# Patient Record
Sex: Female | Born: 1980
Health system: Southern US, Community
[De-identification: ages and names within clinical notes are randomized; demographics above are authoritative.]

## PROBLEM LIST (undated history)

## (undated) DIAGNOSIS — F4310 Post-traumatic stress disorder, unspecified: Secondary | ICD-10-CM

## (undated) DIAGNOSIS — G43909 Migraine, unspecified, not intractable, without status migrainosus: Secondary | ICD-10-CM

## (undated) DIAGNOSIS — M549 Dorsalgia, unspecified: Secondary | ICD-10-CM

## (undated) DIAGNOSIS — M5430 Sciatica, unspecified side: Secondary | ICD-10-CM

## (undated) DIAGNOSIS — E042 Nontoxic multinodular goiter: Secondary | ICD-10-CM

## (undated) HISTORY — PX: ANKLE SURGERY: SHX546

## (undated) HISTORY — PX: TUBAL LIGATION: SHX77

## (undated) HISTORY — DX: Nontoxic multinodular goiter: E04.2

## (undated) HISTORY — DX: Migraine, unspecified, not intractable, without status migrainosus: G43.909

---

## 1999-10-25 ENCOUNTER — Emergency Department (HOSPITAL_COMMUNITY): Admission: EM | Admit: 1999-10-25 | Discharge: 1999-10-25 | Payer: Self-pay | Admitting: Emergency Medicine

## 2004-07-03 ENCOUNTER — Emergency Department (HOSPITAL_COMMUNITY): Admission: EM | Admit: 2004-07-03 | Discharge: 2004-07-03 | Payer: Self-pay | Admitting: Family Medicine

## 2004-08-17 ENCOUNTER — Emergency Department (HOSPITAL_COMMUNITY): Admission: EM | Admit: 2004-08-17 | Discharge: 2004-08-18 | Payer: Self-pay | Admitting: Emergency Medicine

## 2004-08-23 LAB — US OB LIMITED

## 2004-09-17 ENCOUNTER — Inpatient Hospital Stay (HOSPITAL_COMMUNITY): Admission: AD | Admit: 2004-09-17 | Discharge: 2004-09-18 | Payer: Self-pay | Admitting: *Deleted

## 2004-09-22 ENCOUNTER — Inpatient Hospital Stay (HOSPITAL_COMMUNITY): Admission: AD | Admit: 2004-09-22 | Discharge: 2004-09-22 | Payer: Self-pay | Admitting: Obstetrics and Gynecology

## 2004-10-01 ENCOUNTER — Ambulatory Visit: Payer: Self-pay | Admitting: Obstetrics & Gynecology

## 2004-10-22 ENCOUNTER — Ambulatory Visit: Payer: Self-pay | Admitting: Obstetrics & Gynecology

## 2004-11-13 ENCOUNTER — Ambulatory Visit (HOSPITAL_COMMUNITY): Admission: RE | Admit: 2004-11-13 | Discharge: 2004-11-13 | Payer: Self-pay | Admitting: *Deleted

## 2004-11-19 ENCOUNTER — Ambulatory Visit: Payer: Self-pay | Admitting: *Deleted

## 2004-12-10 ENCOUNTER — Ambulatory Visit: Payer: Self-pay | Admitting: *Deleted

## 2004-12-17 ENCOUNTER — Ambulatory Visit: Payer: Self-pay | Admitting: *Deleted

## 2004-12-22 ENCOUNTER — Inpatient Hospital Stay (HOSPITAL_COMMUNITY): Admission: AD | Admit: 2004-12-22 | Discharge: 2004-12-22 | Payer: Self-pay | Admitting: Obstetrics & Gynecology

## 2004-12-31 ENCOUNTER — Inpatient Hospital Stay (HOSPITAL_COMMUNITY): Admission: AD | Admit: 2004-12-31 | Discharge: 2005-01-04 | Payer: Self-pay | Admitting: Obstetrics and Gynecology

## 2004-12-31 ENCOUNTER — Ambulatory Visit: Payer: Self-pay | Admitting: Family Medicine

## 2004-12-31 ENCOUNTER — Ambulatory Visit: Payer: Self-pay | Admitting: *Deleted

## 2005-01-07 ENCOUNTER — Ambulatory Visit: Payer: Self-pay | Admitting: Obstetrics & Gynecology

## 2005-01-14 ENCOUNTER — Ambulatory Visit: Payer: Self-pay | Admitting: *Deleted

## 2005-01-28 ENCOUNTER — Ambulatory Visit: Payer: Self-pay | Admitting: *Deleted

## 2005-02-02 ENCOUNTER — Ambulatory Visit (HOSPITAL_COMMUNITY): Admission: RE | Admit: 2005-02-02 | Discharge: 2005-02-02 | Payer: Self-pay | Admitting: Obstetrics and Gynecology

## 2005-02-11 ENCOUNTER — Ambulatory Visit: Payer: Self-pay | Admitting: *Deleted

## 2005-02-16 ENCOUNTER — Encounter: Admission: RE | Admit: 2005-02-16 | Discharge: 2005-03-10 | Payer: Self-pay | Admitting: *Deleted

## 2005-02-18 ENCOUNTER — Ambulatory Visit: Payer: Self-pay | Admitting: *Deleted

## 2005-03-04 ENCOUNTER — Ambulatory Visit: Payer: Self-pay | Admitting: Obstetrics & Gynecology

## 2005-03-04 ENCOUNTER — Ambulatory Visit (HOSPITAL_COMMUNITY): Admission: RE | Admit: 2005-03-04 | Discharge: 2005-03-04 | Payer: Self-pay | Admitting: *Deleted

## 2005-03-11 ENCOUNTER — Ambulatory Visit: Payer: Self-pay | Admitting: *Deleted

## 2005-03-13 ENCOUNTER — Inpatient Hospital Stay (HOSPITAL_COMMUNITY): Admission: AD | Admit: 2005-03-13 | Discharge: 2005-03-13 | Payer: Self-pay | Admitting: *Deleted

## 2005-03-16 ENCOUNTER — Inpatient Hospital Stay (HOSPITAL_COMMUNITY): Admission: AD | Admit: 2005-03-16 | Discharge: 2005-03-16 | Payer: Self-pay | Admitting: Obstetrics and Gynecology

## 2005-03-18 ENCOUNTER — Ambulatory Visit: Payer: Self-pay | Admitting: *Deleted

## 2005-03-21 ENCOUNTER — Emergency Department (HOSPITAL_COMMUNITY): Admission: AD | Admit: 2005-03-21 | Discharge: 2005-03-21 | Payer: Self-pay | Admitting: Family Medicine

## 2005-03-22 ENCOUNTER — Inpatient Hospital Stay (HOSPITAL_COMMUNITY): Admission: AD | Admit: 2005-03-22 | Discharge: 2005-03-22 | Payer: Self-pay | Admitting: *Deleted

## 2005-03-25 ENCOUNTER — Ambulatory Visit: Payer: Self-pay | Admitting: Obstetrics & Gynecology

## 2005-03-29 ENCOUNTER — Inpatient Hospital Stay (HOSPITAL_COMMUNITY): Admission: AD | Admit: 2005-03-29 | Discharge: 2005-04-01 | Payer: Self-pay | Admitting: *Deleted

## 2005-03-29 ENCOUNTER — Ambulatory Visit: Payer: Self-pay | Admitting: *Deleted

## 2005-04-03 ENCOUNTER — Inpatient Hospital Stay (HOSPITAL_COMMUNITY): Admission: AD | Admit: 2005-04-03 | Discharge: 2005-04-03 | Payer: Self-pay | Admitting: *Deleted

## 2005-04-06 ENCOUNTER — Inpatient Hospital Stay (HOSPITAL_COMMUNITY): Admission: AD | Admit: 2005-04-06 | Discharge: 2005-04-06 | Payer: Self-pay | Admitting: Internal Medicine

## 2005-05-23 ENCOUNTER — Emergency Department (HOSPITAL_COMMUNITY): Admission: EM | Admit: 2005-05-23 | Discharge: 2005-05-23 | Payer: Self-pay | Admitting: Emergency Medicine

## 2007-12-01 DIAGNOSIS — E042 Nontoxic multinodular goiter: Secondary | ICD-10-CM

## 2007-12-01 HISTORY — DX: Nontoxic multinodular goiter: E04.2

## 2010-12-21 ENCOUNTER — Encounter: Payer: Self-pay | Admitting: *Deleted

## 2012-12-23 ENCOUNTER — Encounter (HOSPITAL_BASED_OUTPATIENT_CLINIC_OR_DEPARTMENT_OTHER): Payer: Self-pay

## 2012-12-23 ENCOUNTER — Emergency Department (HOSPITAL_BASED_OUTPATIENT_CLINIC_OR_DEPARTMENT_OTHER): Payer: 59

## 2012-12-23 ENCOUNTER — Emergency Department (HOSPITAL_BASED_OUTPATIENT_CLINIC_OR_DEPARTMENT_OTHER)
Admission: EM | Admit: 2012-12-23 | Discharge: 2012-12-23 | Disposition: A | Discharge status: Home / Self Care | Payer: 59 | Attending: Emergency Medicine | Admitting: Emergency Medicine

## 2012-12-23 DIAGNOSIS — M25569 Pain in unspecified knee: Secondary | ICD-10-CM | POA: Insufficient documentation

## 2012-12-23 DIAGNOSIS — M25469 Effusion, unspecified knee: Secondary | ICD-10-CM | POA: Insufficient documentation

## 2012-12-23 DIAGNOSIS — F17200 Nicotine dependence, unspecified, uncomplicated: Secondary | ICD-10-CM | POA: Insufficient documentation

## 2012-12-23 DIAGNOSIS — M2550 Pain in unspecified joint: Secondary | ICD-10-CM

## 2012-12-23 LAB — BASIC METABOLIC PANEL
BUN: 10 mg/dL (ref 6–23)
CO2: 24 mEq/L (ref 19–32)
Calcium: 9 mg/dL (ref 8.4–10.5)
Chloride: 103 mEq/L (ref 96–112)
Creatinine, Ser: 0.8 mg/dL (ref 0.50–1.10)
GFR calc Af Amer: >90 mL/min (ref >90)
GFR calc non Af Amer: >90 mL/min (ref >90)
Glucose, Bld: 106 mg/dL — ABNORMAL HIGH (ref 70–99)
Potassium: 3.8 mEq/L (ref 3.5–5.1)
Sodium: 137 mEq/L (ref 135–145)

## 2012-12-23 LAB — CBC WITH DIFFERENTIAL/PLATELET
Basophils Absolute: 0 10*3/uL (ref 0.0–0.1)
Basophils Relative: 0 % (ref 0–1)
Eosinophils Absolute: 0.4 10*3/uL (ref 0.0–0.7)
Eosinophils Relative: 6 % — ABNORMAL HIGH (ref 0–5)
HCT: 37.7 % (ref 36.0–46.0)
Hemoglobin: 12.9 g/dL (ref 12.0–15.0)
Lymphocytes Relative: 39 % (ref 12–46)
Lymphs Abs: 2.5 10*3/uL (ref 0.7–4.0)
MCH: 31.5 pg (ref 26.0–34.0)
MCHC: 34.2 g/dL (ref 30.0–36.0)
MCV: 92.2 fL (ref 78.0–100.0)
Monocytes Absolute: 1.1 10*3/uL — ABNORMAL HIGH (ref 0.1–1.0)
Monocytes Relative: 16 % — ABNORMAL HIGH (ref 3–12)
Neutro Abs: 2.5 10*3/uL (ref 1.7–7.7)
Neutrophils Relative %: 39 % — ABNORMAL LOW (ref 43–77)
Platelets: 222 10*3/uL (ref 150–400)
RBC: 4.09 MIL/uL (ref 3.87–5.11)
RDW: 12.6 % (ref 11.5–15.5)
WBC: 6.5 10*3/uL (ref 4.0–10.5)

## 2012-12-23 LAB — D-DIMER, QUANTITATIVE: D-Dimer, Quant: 0.27 ug/mL-FEU (ref 0.00–0.48)

## 2012-12-23 MED ORDER — DIPHENHYDRAMINE HCL 25 MG PO CAPS
25.0000 mg | ORAL_CAPSULE | Freq: Once | ORAL | Status: AC
  Administered 2012-12-23: 25 mg via ORAL

## 2012-12-23 MED ORDER — NAPROXEN 500 MG PO TABS: 500.0000 mg | ORAL_TABLET | Freq: Two times a day (BID) | ORAL | Status: DC

## 2012-12-23 MED ORDER — OXYCODONE-ACETAMINOPHEN 5-325 MG PO TABS
1.0000 | ORAL_TABLET | Freq: Once | ORAL | Status: AC
  Administered 2012-12-23: 1 via ORAL
  Filled 2012-12-23 (×2): qty 1

## 2012-12-23 MED ORDER — HYDROCODONE-ACETAMINOPHEN 5-500 MG PO TABS: 1.0000 | ORAL_TABLET | Freq: Four times a day (QID) | ORAL | Status: DC | PRN

## 2012-12-23 MED ORDER — DIPHENHYDRAMINE HCL 25 MG PO CAPS
ORAL_CAPSULE | ORAL | Status: AC
  Administered 2012-12-23: 25 mg via ORAL
  Filled 2012-12-23: qty 1

## 2012-12-23 NOTE — ED Notes (Signed)
Patient transported to X-ray 

## 2012-12-23 NOTE — ED Notes (Signed)
Pt returned from radiology.

## 2012-12-23 NOTE — ED Notes (Signed)
Pt reports onset of left knee pain/swelling and bilateral ankle swelling x 2 days.

## 2012-12-23 NOTE — ED Provider Notes (Signed)
History    CSN: [id number] Arrival date & time 12/23/12  1041 First MD Initiated Contact with Patient 12/23/12 1124      Chief Complaint  Patient presents with  . Joint Swelling  . Knee Pain    HPI The patient started having pain in her left knee and both ankles a few days ago. She's not sure what triggered it. She has not had any particular injuries or illnesses.  The pain is primarily in her left knee as well as both ankles. She feels that they are somewhat swollen. The pain is sharp and increases with movement. She has no history of any rheumatologic conditions. She has not had any trouble with calf pain and denies any chest pain.  She has not had any cough. No  history of DVT or PE. Patient denies any general medical problems.  History reviewed. No pertinent past medical history.  Past Surgical History  Procedure Date  . Cesarean section     No family history on file.  History  Substance Use Topics  . Smoking status: Current Every Day Smoker -- 0.5 packs/day    Types: Cigarettes  . Smokeless tobacco: Not on file  . Alcohol Use: No    OB History    Grav Para Term Preterm Abortions TAB SAB Ect Mult Living                  Review of Systems  Constitutional: Negative for fever and fatigue.  Respiratory: Negative for cough.   Gastrointestinal: Negative for vomiting and diarrhea.  Genitourinary: Negative for frequency.  Neurological: Negative for seizures.  Hematological: Negative for adenopathy. Does not bruise/bleed easily.  All other systems reviewed and are negative.    Allergies  Review of patient's allergies indicates no known allergies.  Home Medications  No current outpatient prescriptions on file.  BP 130/89  Pulse 96  Temp 98.9 F (37.2 C) (Oral)  Resp 18  Ht 5\' 2"  (1.575 m)  Wt 216 lb (97.977 kg)  BMI 39.51 kg/m2  SpO2 100%  Physical Exam  Nursing note and vitals reviewed. Constitutional: She appears well-developed and well-nourished. No  distress.  HENT:  Head: Normocephalic and atraumatic.  Right Ear: External ear normal.  Left Ear: External ear normal.  Eyes: Conjunctivae normal are normal. Right eye exhibits no discharge. Left eye exhibits no discharge. No scleral icterus.  Neck: Neck supple. No tracheal deviation present.  Cardiovascular: Normal rate, regular rhythm and intact distal pulses.   Pulmonary/Chest: Effort normal and breath sounds normal. No stridor. No respiratory distress. She has no wheezes. She has no rales.  Abdominal: Soft. Bowel sounds are normal. She exhibits no distension. There is no tenderness. There is no rebound and no guarding.  Musculoskeletal: She exhibits no edema and no tenderness.       Left knee: She exhibits no swelling and no effusion. tenderness found.       Right ankle: She exhibits no swelling, no ecchymosis and no deformity. tenderness.       Left ankle: She exhibits no swelling, no ecchymosis and no deformity. tenderness.       No effusion or edema noted  Neurological: She is alert. She has normal strength. No sensory deficit. Cranial nerve deficit:  no gross defecits noted. She exhibits normal muscle tone. She displays no seizure activity. Coordination normal.  Skin: Skin is warm and dry. No rash noted.  Psychiatric: She has a normal mood and affect.    ED Course  Procedures (  including critical care time)  Labs Reviewed  CBC WITH DIFFERENTIAL - Abnormal; Notable for the following:    Neutrophils Relative 39 (*)     Monocytes Relative 16 (*)     Monocytes Absolute 1.1 (*)     Eosinophils Relative 6 (*)     All other components within normal limits  BASIC METABOLIC PANEL - Abnormal; Notable for the following:    Glucose, Bld 106 (*)     All other components within normal limits  D-DIMER, QUANTITATIVE   Dg Knee 1-2 Views Left  12/23/2012  *RADIOLOGY REPORT*  Clinical Data: Joint swelling, pain.  LEFT KNEE - 1-2 VIEW  Comparison: None.  Findings: No acute bony abnormality.   Specifically, no fracture, subluxation, or dislocation.  Soft tissues are intact.  No joint effusion.  IMPRESSION: No acute bony abnormality.   Original Report Authenticated By: Charlett Nose, M.D.        MDM  The patient's laboratory tests and x-rays are unremarkable. On physical exam she has no evidence of joint effusion, or erythema.  Her symptoms did not suggest a septic arthritis. Is possible that her symptoms could be related to some type of viral illness. It is also possible she could have some of rheumatological condition. at this point there does not appear to be evidence of an acute emergency medical condition. I recommend followup with primary-care Dr. if the symptoms persist        Celene Kras, MD 12/23/12 1246

## 2012-12-30 ENCOUNTER — Encounter (HOSPITAL_BASED_OUTPATIENT_CLINIC_OR_DEPARTMENT_OTHER): Payer: Self-pay | Admitting: *Deleted

## 2012-12-30 ENCOUNTER — Emergency Department (HOSPITAL_BASED_OUTPATIENT_CLINIC_OR_DEPARTMENT_OTHER)
Admission: EM | Admit: 2012-12-30 | Discharge: 2012-12-30 | Disposition: A | Discharge status: Home / Self Care | Payer: 59 | Attending: Emergency Medicine | Admitting: Emergency Medicine

## 2012-12-30 ENCOUNTER — Emergency Department (HOSPITAL_BASED_OUTPATIENT_CLINIC_OR_DEPARTMENT_OTHER): Payer: 59

## 2012-12-30 DIAGNOSIS — F17200 Nicotine dependence, unspecified, uncomplicated: Secondary | ICD-10-CM | POA: Insufficient documentation

## 2012-12-30 DIAGNOSIS — M79605 Pain in left leg: Secondary | ICD-10-CM

## 2012-12-30 DIAGNOSIS — Z79899 Other long term (current) drug therapy: Secondary | ICD-10-CM | POA: Insufficient documentation

## 2012-12-30 DIAGNOSIS — M79609 Pain in unspecified limb: Secondary | ICD-10-CM | POA: Insufficient documentation

## 2012-12-30 MED ORDER — PREDNISONE 10 MG PO TABS: 20.0000 mg | ORAL_TABLET | Freq: Two times a day (BID) | ORAL | Status: DC

## 2012-12-30 NOTE — ED Provider Notes (Signed)
History     CSN: [id number]  Arrival date & time 12/30/12  1554   First MD Initiated Contact with Patient 12/30/12 1628      Chief Complaint  Patient presents with  . Leg Pain    (Consider location/radiation/quality/duration/timing/severity/associated sxs/prior treatment) HPI Comments: Patient for eval of 1 1/2 week history of pain in the back of the left knee and calf.  She denies any injury or trauma.  She was seen here for the same one week ago and given nsaids and pain meds.  She is not feeling any better.  No chest pain or shortness of breath.  Patient is a 32 y.o. female presenting with leg pain. The history is provided by the patient.  Leg Pain  Incident onset: 1 1/2 weeks ago. There was no injury mechanism. Pain location: back of left knee and calf. The quality of the pain is described as aching and throbbing. The pain is moderate. The pain has been constant since onset. Pertinent negatives include no numbness, no loss of motion and no loss of sensation. The symptoms are aggravated by activity, bearing weight and palpation.    History reviewed. No pertinent past medical history.  Past Surgical History  Procedure Date  . Cesarean section     No family history on file.  History  Substance Use Topics  . Smoking status: Current Every Day Smoker -- 0.5 packs/day    Types: Cigarettes  . Smokeless tobacco: Not on file  . Alcohol Use: No    OB History    Grav Para Term Preterm Abortions TAB SAB Ect Mult Living                  Review of Systems  Neurological: Negative for numbness.  All other systems reviewed and are negative.    Allergies  Review of patient's allergies indicates no known allergies.  Home Medications   Current Outpatient Rx  Name  Route  Sig  Dispense  Refill  . HYDROCODONE-ACETAMINOPHEN 5-500 MG PO TABS   Oral   Take 1-2 tablets by mouth every 6 (six) hours as needed for pain.   15 tablet   0   . NAPROXEN 500 MG PO TABS   Oral   Take  1 tablet (500 mg total) by mouth 2 (two) times daily with a meal.   30 tablet   0     BP 126/81  Pulse 101  Temp 98 F (36.7 C) (Oral)  Resp 18  Ht 5\' 2"  (1.575 m)  Wt 216 lb (97.977 kg)  BMI 39.51 kg/m2  SpO2 99%  LMP 12/19/2012  Physical Exam  Nursing note and vitals reviewed. Constitutional: She is oriented to person, place, and time. She appears well-developed and well-nourished. No distress.  HENT:  Head: Normocephalic and atraumatic.  Mouth/Throat: Oropharynx is clear and moist.  Neck: Normal range of motion. Neck supple.  Musculoskeletal:       The left knee appears grossly normal.  There is no definite effusion.  There is tpp in the posterior aspect of the knee and upper calf.  There is no edema or distal swelling.  Homans sign is absent.  Neurological: She is alert and oriented to person, place, and time.  Skin: Skin is warm and dry. She is not diaphoretic.    ED Course  Procedures (including critical care time)  Labs Reviewed - No data to display No results found.   No diagnosis found.    MDM  The xrays  from last week are negative and the dvt study today is negative as well.  Her discomfort is mainly in the back of the upper calf and knee.  I will treat her with prednisone, give contact information for orthopedic follow up.          Geoffery Lyons, MD 12/30/12 [phone]

## 2012-12-30 NOTE — ED Notes (Signed)
Onset x 11/2 weeks  Seen here for same last Friday

## 2012-12-30 NOTE — Discharge Instructions (Signed)
Pain of Unknown Etiology (Pain Without a Known Cause)  You have come to your caregiver because of pain. Pain can occur in any part of the body. Often there is not a definite cause. If your laboratory (blood or urine) work was normal and x-rays or other studies were normal, your caregiver may treat you without knowing the cause of the pain. An example of this is the headache. Most headaches are diagnosed by taking a history. This means your caregiver asks you questions about your headaches. Your caregiver determines a treatment based on your answers. Usually testing done for headaches is normal. Often testing is not done unless there is no response to medications. Regardless of where your pain is located today, you can be given medications to make you comfortable. If no physical cause of pain can be found, most cases of pain will gradually leave as suddenly as they came.   If you have a painful condition and no reason can be found for the pain, It is importantthat you follow up with your caregiver. If the pain becomes worse or does not go away, it may be necessary to repeat tests and look further for a possible cause.   Only take over-the-counter or prescription medicines for pain, discomfort, or fever as directed by your caregiver.   For the protection of your privacy, test results can not be given over the phone. Make sure you receive the results of your test. Ask as to how these results are to be obtained if you have not been informed. It is your responsibility to obtain your test results.   You may continue all activities unless the activities cause more pain. When the pain lessens, it is important to gradually resume normal activities. Resume activities by beginning slowly and gradually increasing the intensity and duration of the activities or exercise. During periods of severe pain, bed-rest may be helpful. Lay or sit in any position that is comfortable.   Ice used for acute (sudden) conditions may be  effective. Use a large plastic bag filled with ice and wrapped in a towel. This may provide pain relief.   See your caregiver for continued problems. They can help or refer you for exercises or physical therapy if necessary.  If you were given medications for your condition, do not drive, operate machinery or power tools, or sign legal documents for 24 hours. Do not drink alcohol, take sleeping pills, or take other medications that may interfere with treatment.  See your caregiver immediately if you have pain that is becoming worse and not relieved by medications.  Document Released: 08/11/2001 Document Revised: 02/08/2012 Document Reviewed: 11/16/2005  Exit[employer] Patient Information 2013 [employer].

## 2013-02-03 ENCOUNTER — Ambulatory Visit: Payer: 59 | Admitting: Family

## 2013-02-14 ENCOUNTER — Encounter: Payer: Self-pay | Admitting: Family

## 2013-02-14 ENCOUNTER — Ambulatory Visit (INDEPENDENT_AMBULATORY_CARE_PROVIDER_SITE_OTHER): Payer: 59 | Admitting: Family

## 2013-02-14 VITALS — BP 118/80 | HR 88 | Temp 98.9°F | Resp 16 | Ht 64.0 in | Wt 209.1 lb

## 2013-02-14 DIAGNOSIS — IMO0001 Reserved for inherently not codable concepts without codable children: Secondary | ICD-10-CM

## 2013-02-14 DIAGNOSIS — F418 Other specified anxiety disorders: Secondary | ICD-10-CM

## 2013-02-14 DIAGNOSIS — Z862 Personal history of diseases of the blood and blood-forming organs and certain disorders involving the immune mechanism: Secondary | ICD-10-CM

## 2013-02-14 DIAGNOSIS — N760 Acute vaginitis: Secondary | ICD-10-CM | POA: Insufficient documentation

## 2013-02-14 DIAGNOSIS — G43909 Migraine, unspecified, not intractable, without status migrainosus: Secondary | ICD-10-CM

## 2013-02-14 DIAGNOSIS — J309 Allergic rhinitis, unspecified: Secondary | ICD-10-CM

## 2013-02-14 DIAGNOSIS — F341 Dysthymic disorder: Secondary | ICD-10-CM

## 2013-02-14 DIAGNOSIS — M2550 Pain in unspecified joint: Secondary | ICD-10-CM

## 2013-02-14 DIAGNOSIS — R32 Unspecified urinary incontinence: Secondary | ICD-10-CM

## 2013-02-14 DIAGNOSIS — Z8639 Personal history of other endocrine, nutritional and metabolic disease: Secondary | ICD-10-CM

## 2013-02-14 DIAGNOSIS — F17200 Nicotine dependence, unspecified, uncomplicated: Secondary | ICD-10-CM

## 2013-02-14 DIAGNOSIS — Z720 Tobacco use: Secondary | ICD-10-CM | POA: Insufficient documentation

## 2013-02-14 DIAGNOSIS — N898 Other specified noninflammatory disorders of vagina: Secondary | ICD-10-CM

## 2013-02-14 DIAGNOSIS — E0590 Thyrotoxicosis, unspecified without thyrotoxic crisis or storm: Secondary | ICD-10-CM

## 2013-02-14 LAB — CBC WITH DIFFERENTIAL/PLATELET
Basophils Absolute: 0 10*3/uL (ref 0.0–0.1)
Basophils Relative: 0 % (ref 0–1)
Eosinophils Absolute: 0.2 10*3/uL (ref 0.0–0.7)
Eosinophils Relative: 3 % (ref 0–5)
HCT: 40.4 % (ref 36.0–46.0)
Hemoglobin: 13.5 g/dL (ref 12.0–15.0)
Lymphocytes Relative: 39 % (ref 12–46)
Lymphs Abs: 2.8 10*3/uL (ref 0.7–4.0)
MCH: 29.7 pg (ref 26.0–34.0)
MCHC: 33.4 g/dL (ref 30.0–36.0)
MCV: 88.8 fL (ref 78.0–100.0)
Monocytes Absolute: 0.6 10*3/uL (ref 0.1–1.0)
Monocytes Relative: 9 % (ref 3–12)
Neutro Abs: 3.4 10*3/uL (ref 1.7–7.7)
Neutrophils Relative %: 49 % (ref 43–77)
Platelets: 272 10*3/uL (ref 150–400)
RBC: 4.55 MIL/uL (ref 3.87–5.11)
RDW: 13.1 % (ref 11.5–15.5)
WBC: 7 10*3/uL (ref 4.0–10.5)

## 2013-02-14 LAB — HEPATIC FUNCTION PANEL
ALT: 12 U/L (ref 0–35)
AST: 15 U/L (ref 0–37)
Albumin: 4.3 g/dL (ref 3.5–5.2)
Alkaline Phosphatase: 58 U/L (ref 39–117)
Bilirubin, Direct: 0.1 mg/dL (ref 0.0–0.3)
Indirect Bilirubin: 0.3 mg/dL (ref 0.0–0.9)
Total Bilirubin: 0.4 mg/dL (ref 0.3–1.2)
Total Protein: 7.3 g/dL (ref 6.0–8.3)

## 2013-02-14 LAB — SEDIMENTATION RATE: Sed Rate: 11 mm/hr (ref 0–22)

## 2013-02-14 LAB — T3, FREE: T3, Free: 3 pg/mL (ref 2.3–4.2)

## 2013-02-14 LAB — TSH: TSH: 0.706 u[IU]/mL (ref 0.350–4.500)

## 2013-02-14 MED ORDER — LORATADINE 10 MG PO TABS: 10.0000 mg | ORAL_TABLET | Freq: Every day | ORAL | Status: DC

## 2013-02-14 MED ORDER — DULOXETINE HCL 30 MG PO CPEP: ORAL_CAPSULE | ORAL | Status: DC

## 2013-02-14 NOTE — Assessment & Plan Note (Signed)
Trial of claritin.  Likely cause for hoarsenss/nasal congestion.

## 2013-02-14 NOTE — Assessment & Plan Note (Signed)
Tend to be menstrual migraines and improve with imitrex injection.

## 2013-02-14 NOTE — Assessment & Plan Note (Signed)
Left lobe of thyroid is prominent today on exam.  Requested records from [facility]. Will obtain TSH, free t3/freet4.  Consider follow up neck ultrasound pending review of records from [facility].

## 2013-02-14 NOTE — Assessment & Plan Note (Signed)
She is motivated to quit.  5 minutes spent counseling pt on importance of quitting smoking. She will plan to try the nicorette gum.

## 2013-02-14 NOTE — Assessment & Plan Note (Signed)
Will obtain ANA, ESR, RA.  Declines HIV as she reports that she had this drawn 2 weeks ago.

## 2013-02-14 NOTE — Progress Notes (Signed)
Subjective:    Patient ID: Kayla Jenkins, female    DOB: 1981-04-11, 32 y.o.   MRN: [id number]  HPI  Kayla Jenkins is a 32 yr old female who presents today to establish care.    Some voice hoarseness, neck stiffness,  sinus drainage (clear to yellow in color), cough.  Reports that this has been going on for 5 years.   She is not taking any medication.  She reports that these symptoms are worse in the winter time.    Tobacco abuse- She smokes 1/2 PPD.  Quit for 3 months.  She is motived to quit.    Migraine-  Has had migraines since age of 89- she uses imitrex injection.  She reports migraines tend to occur around the time of her menses.   Thyroid Nodules- She reports that she has had a nuclear study.  She was told that she was working with an urgent care.  Was completed at [facility].    Depression- reports some moodiness, occasional tearfulness. Sleeps more than "i should."  Poor energy.  Less interest in thinks she enjoys.  Denies thoughts of suicide, homicide.  Anxiety- reports + panic attacks due to feeling overwhelmed.  Panic attack last Friday at home.  Happened out of the blue.    Vaginal discharge- notes that she has occasional vaginal irritation. She has not had any new partners but wants to be checked for "everything."   Review of Systems  Constitutional: Positive for unexpected weight change.       Notes weight has been trending up.    HENT: Negative for ear pain.   Eyes:       Reports some blurring of her vision with reading.  Last eye exam was 1 yr ago- rx'd glasses  Respiratory: Negative for shortness of breath.   Cardiovascular: Negative for chest pain.  Gastrointestinal: Negative for nausea, vomiting and diarrhea.  Genitourinary: Positive for frequency. Negative for dysuria and menstrual problem.       Reports occasional slight urinary incontinence Heavy clotting with periods  Musculoskeletal: Positive for arthralgias. Negative for myalgias.       + knee pain  Skin:  Negative for rash.  Neurological: Positive for headaches.  Hematological: Positive for adenopathy.       Reports some swollen glands in her neck  Psychiatric/Behavioral:       Reports + depression/anxiety   Past Medical History  Diagnosis Date  . Migraine   . Multiple thyroid nodules 2009    History   Social History  . Marital Status: Married    Spouse Name: N/A    Number of Children: N/A  . Years of Education: N/A   Occupational History  . Not on file.   Social History Main Topics  . Smoking status: Current Every Day Smoker -- 0.50 packs/day    Types: Cigarettes  . Smokeless tobacco: Never Used  . Alcohol Use: No  . Drug Use: No  . Sexually Active: Not on file   Other Topics Concern  . Not on file   Social History Narrative   2 children   Single   Works in Training and development officer at [employer]   Rare ETOH   Denies drug use   Has good support team in the area of family/friends   Enjoys travelling.           Past Surgical History  Procedure Laterality Date  . Cesarean section      2001 and 2006    Family History  Problem Relation Age of Onset  . Hypertension Mother   . Heart disease Mother     CHF  . Seizures Father   . Hyperlipidemia Father   . Cancer Neg Hx   . Kidney disease Neg Hx     No Known Allergies  No current outpatient prescriptions on file prior to visit.   No current facility-administered medications on file prior to visit.    BP 118/80  Pulse 88  Temp(Src) 98.9 F (37.2 C) (Oral)  Resp 16  Ht 5\' 4"  (1.626 m)  Wt 209 lb 1.9 oz (94.856 kg)  BMI 35.88 kg/m2  SpO2 99%  LMP 02/07/2013       Objective:   Physical Exam  Constitutional: She is oriented to person, place, and time. She appears well-developed and well-nourished. No distress.  HENT:  Head: Normocephalic and atraumatic.  Right Ear: Tympanic membrane and ear canal normal.  Left Ear: Tympanic membrane and ear canal normal.  Neck:  Prominent left thyroid lobe.  +  thyromegaly.  Cardiovascular: Normal rate and regular rhythm.   No murmur heard. Pulmonary/Chest: Effort normal and breath sounds normal. No respiratory distress. She has no wheezes. She has no rales. She exhibits no tenderness.  Musculoskeletal: She exhibits no edema.  Neurological: She is alert and oriented to person, place, and time. She exhibits normal muscle tone.  Skin: Skin is warm and dry.  Psychiatric: She has a normal mood and affect. Her behavior is normal. Judgment and thought content normal.  External genitalia: Normal  BUS/Urethra/Skene's glands: Normal  Bladder: Normal  Vagina: Normal  Anus and perineum: Normal (GYN exam performed with female chaperone present)         Assessment & Plan:

## 2013-02-14 NOTE — Assessment & Plan Note (Signed)
Pt tender in 16/18 tender points.  I am suspicious for depression and FM.  Will plan to give trial of cymbalta.

## 2013-02-14 NOTE — Assessment & Plan Note (Signed)
Wet prep obtained today along with GC/Chlamydia.

## 2013-02-14 NOTE — Patient Instructions (Addendum)
Please start claritin 10mg  once daily. Start cymbalta 30mg  once daily on week one, then increase as tolerated to 60mg  (2 tabs) on week two. Follow up in 1 month.

## 2013-02-15 ENCOUNTER — Telehealth: Payer: Self-pay | Admitting: Family

## 2013-02-15 LAB — BASIC METABOLIC PANEL WITH GFR
BUN: 9 mg/dL (ref 6–23)
CO2: 27 mEq/L (ref 19–32)
Calcium: 9.8 mg/dL (ref 8.4–10.5)
Chloride: 104 mEq/L (ref 96–112)
Creat: 0.74 mg/dL (ref 0.50–1.10)
GFR, Est African American: >89 mL/min
GFR, Est Non African American: >89 mL/min
Glucose, Bld: 86 mg/dL (ref 70–99)
Potassium: 4.2 mEq/L (ref 3.5–5.3)
Sodium: 137 mEq/L (ref 135–145)

## 2013-02-15 LAB — RHEUMATOID FACTOR: Rheumatoid fact SerPl-aCnc: <10 IU/mL (ref <=14)

## 2013-02-15 LAB — ANA: Anti Nuclear Antibody(ANA): NEGATIVE

## 2013-02-15 LAB — WET PREP BY MOLECULAR PROBE: Gardnerella vaginalis: POSITIVE — AB

## 2013-02-15 MED ORDER — METRONIDAZOLE 500 MG PO TABS: 500.0000 mg | ORAL_TABLET | Freq: Three times a day (TID) | ORAL | Status: DC

## 2013-02-15 NOTE — Telephone Encounter (Signed)
Notified pt and she voices understanding. Spoke with pharmacist and clarified directions of metronidazole as 1 tablet twice a day instead of 3 times a day as previously sent.

## 2013-02-15 NOTE — Telephone Encounter (Addendum)
Left message on home phone requesting call back.  When pt returns call please let her know that autoimmune testing is negative. One more test is pending.  Liver thyroid, kidney function, blood count all normal.  Gonorrhea/chlamydia neg.  Wet prep notes + BV and + trichomonas.  I would like her to take metronidazole bid x 7 days for both. Avoid alcohol while on metronidazole. Partner needs to be treated for trich as well as this is a sexually transmitted infection.

## 2013-02-20 ENCOUNTER — Telehealth: Payer: Self-pay | Admitting: *Deleted

## 2013-02-20 NOTE — Telephone Encounter (Signed)
Received prior auth request on cymbalta Rx from [facility]. PA form obtained and forwarded to Provider for completion/signature.

## 2013-02-21 ENCOUNTER — Telehealth: Payer: Self-pay | Admitting: Family

## 2013-02-21 NOTE — Telephone Encounter (Signed)
Received medical records from [facility] °

## 2013-02-21 NOTE — Telephone Encounter (Signed)
Form faxed to [facility] at [phone]. Awaiting approval/denial response.

## 2013-02-27 NOTE — Telephone Encounter (Signed)
Received approval from [facility] for Cymbalta 30mg  from 02/23/13 through 05/27/13. Will call to determine reason for short term approval.

## 2013-03-15 NOTE — Telephone Encounter (Signed)
Charles w/[employer] is looking into case and will call us back.

## 2013-03-15 NOTE — Telephone Encounter (Signed)
Received call from Leonette Most at [employer] stating Cymbalta was only approved for 3 months as it was noted that pt would be titrating up to 2 capsules (60mg ) daily. He states once pt is at maintenance dose we can re-initiate prior auth for 60mg  once daily.

## 2013-03-24 ENCOUNTER — Encounter: Payer: Self-pay | Admitting: Family

## 2013-03-24 ENCOUNTER — Ambulatory Visit (INDEPENDENT_AMBULATORY_CARE_PROVIDER_SITE_OTHER): Payer: 59 | Admitting: Family

## 2013-03-24 VITALS — BP 120/68 | HR 92 | Temp 98.5°F | Resp 16 | Ht 64.0 in | Wt 213.0 lb

## 2013-03-24 DIAGNOSIS — M2550 Pain in unspecified joint: Secondary | ICD-10-CM

## 2013-03-24 DIAGNOSIS — Z8639 Personal history of other endocrine, nutritional and metabolic disease: Secondary | ICD-10-CM

## 2013-03-24 DIAGNOSIS — F418 Other specified anxiety disorders: Secondary | ICD-10-CM

## 2013-03-24 DIAGNOSIS — Z862 Personal history of diseases of the blood and blood-forming organs and certain disorders involving the immune mechanism: Secondary | ICD-10-CM

## 2013-03-24 DIAGNOSIS — N926 Irregular menstruation, unspecified: Secondary | ICD-10-CM

## 2013-03-24 DIAGNOSIS — N760 Acute vaginitis: Secondary | ICD-10-CM

## 2013-03-24 DIAGNOSIS — F341 Dysthymic disorder: Secondary | ICD-10-CM

## 2013-03-24 NOTE — Assessment & Plan Note (Signed)
Autoimmune testing was negative.  No significant improvement in myalgia with cymbalta.

## 2013-03-24 NOTE — Assessment & Plan Note (Signed)
Improved on cymbalta. Continue same.  

## 2013-03-24 NOTE — Assessment & Plan Note (Signed)
Will refer to gyn. She also has hirsuitism and I am concerned re: possibility of PCOS.

## 2013-03-24 NOTE — Assessment & Plan Note (Signed)
Recent rx for trich and BV.  Will re-swab today as she remains symptomatic. States partner was treated.

## 2013-03-24 NOTE — Patient Instructions (Addendum)
Please follow up in 3 months. Sooner if problems/concerns.  

## 2013-03-24 NOTE — Progress Notes (Signed)
  Subjective:    Patient ID: Elisha Headland, female    DOB: 05-07-1981, 32 y.o.   MRN: [id number]  HPI Irregular menses- reports heavy irregular menses, associated mood swings. "something is going on with my hormones."  Depression- last visit she was started on cymbalta. Mood is better.  Myalgias are unchanged.    BV/Trichomonas- treated with metronidazole last visit. She reports that her partner was treated.    Reports ?PCOS   Review of Systems See HPI  Past Medical History  Diagnosis Date  . Migraine   . Multiple thyroid nodules 2009    History   Social History  . Marital Status: Married    Spouse Name: N/A    Number of Children: N/A  . Years of Education: N/A   Occupational History  . Not on file.   Social History Main Topics  . Smoking status: Former Smoker -- 0.50 packs/day    Types: Cigarettes    Quit date: 03/17/2013  . Smokeless tobacco: Never Used  . Alcohol Use: No  . Drug Use: No  . Sexually Active: Not on file   Other Topics Concern  . Not on file   Social History Narrative   2 children   Single   Works in Training and development officer at [employer]   Rare ETOH   Denies drug use   Has good support team in the area of family/friends   Enjoys travelling.           Past Surgical History  Procedure Laterality Date  . Cesarean section      2001 and 2006    Family History  Problem Relation Age of Onset  . Hypertension Mother   . Heart disease Mother     CHF  . Seizures Father   . Hyperlipidemia Father   . Cancer Neg Hx   . Kidney disease Neg Hx     No Known Allergies  Current Outpatient Prescriptions on File Prior to Visit  Medication Sig Dispense Refill  . DULoxetine (CYMBALTA) 30 MG capsule Take one tablet by mouth once daily for 1 week, then increase to 2 tablets once daily on week tw  60 capsule  0  . loratadine (CLARITIN) 10 MG tablet Take 1 tablet (10 mg total) by mouth daily.  30 tablet  11   No current facility-administered medications  on file prior to visit.    BP 120/68  Pulse 92  Temp(Src) 98.5 F (36.9 C) (Oral)  Resp 16  Ht 5\' 4"  (1.626 m)  Wt 213 lb 0.6 oz (96.634 kg)  BMI 36.55 kg/m2  SpO2 98%  LMP 02/28/2013       Objective:   Physical Exam  Constitutional: She is oriented to person, place, and time. She appears well-developed and well-nourished. No distress.  HENT:  Head: Normocephalic and atraumatic.  Neck: Thyromegaly present.  Cardiovascular: Normal rate and regular rhythm.   No murmur heard. Pulmonary/Chest: Effort normal and breath sounds normal. No respiratory distress. She has no wheezes. She has no rales. She exhibits no tenderness.  Neurological: She is alert and oriented to person, place, and time.  Skin: Skin is warm and dry.  Psychiatric: She has a normal mood and affect. Her behavior is normal. Judgment and thought content normal.          Assessment & Plan:

## 2013-03-25 LAB — WET PREP BY MOLECULAR PROBE
Candida species: NEGATIVE
Trichomonas vaginosis: NEGATIVE

## 2013-03-26 DIAGNOSIS — Z8639 Personal history of other endocrine, nutritional and metabolic disease: Secondary | ICD-10-CM | POA: Insufficient documentation

## 2013-03-26 NOTE — Assessment & Plan Note (Signed)
Reviewed records from [facility].  Thyroid imaging was not included.  Will request again.

## 2013-03-27 ENCOUNTER — Telehealth: Payer: Self-pay | Admitting: Family

## 2013-03-27 MED ORDER — METRONIDAZOLE 500 MG PO TABS: 500.0000 mg | ORAL_TABLET | Freq: Two times a day (BID) | ORAL | Status: DC

## 2013-03-27 NOTE — Telephone Encounter (Signed)
Patient informed, understood & agreed/SLS  

## 2013-03-27 NOTE — Telephone Encounter (Signed)
Reviewed wet prep- + for BV.  It was negative for trichomonas. Will rx with metronidazole. Pls notify pt.

## 2013-03-31 ENCOUNTER — Telehealth: Payer: Self-pay | Admitting: Family

## 2013-03-31 DIAGNOSIS — E010 Iodine-deficiency related diffuse (endemic) goiter: Secondary | ICD-10-CM

## 2013-03-31 NOTE — Telephone Encounter (Signed)
Notified pt and she is agreeable to proceed with u/s.

## 2013-03-31 NOTE — Telephone Encounter (Signed)
Pls call pt and let her know that I reviewed her thyroid scan results from [facility].  I would like her to have a follow up thyroid US please.  Pended below.

## 2013-04-04 ENCOUNTER — Ambulatory Visit (HOSPITAL_BASED_OUTPATIENT_CLINIC_OR_DEPARTMENT_OTHER): Payer: 59

## 2013-06-07 ENCOUNTER — Emergency Department (HOSPITAL_COMMUNITY)
Admission: EM | Admit: 2013-06-07 | Discharge: 2013-06-07 | Disposition: A | Discharge status: Home / Self Care | Payer: PRIVATE HEALTH INSURANCE | Attending: Emergency Medicine | Admitting: Emergency Medicine

## 2013-06-07 ENCOUNTER — Encounter (HOSPITAL_COMMUNITY): Payer: Self-pay | Admitting: Emergency Medicine

## 2013-06-07 DIAGNOSIS — S239XXA Sprain of unspecified parts of thorax, initial encounter: Secondary | ICD-10-CM | POA: Diagnosis not present

## 2013-06-07 DIAGNOSIS — Y9269 Other specified industrial and construction area as the place of occurrence of the external cause: Secondary | ICD-10-CM | POA: Insufficient documentation

## 2013-06-07 DIAGNOSIS — Z87891 Personal history of nicotine dependence: Secondary | ICD-10-CM | POA: Diagnosis not present

## 2013-06-07 DIAGNOSIS — IMO0002 Reserved for concepts with insufficient information to code with codable children: Secondary | ICD-10-CM | POA: Diagnosis present

## 2013-06-07 DIAGNOSIS — X500XXA Overexertion from strenuous movement or load, initial encounter: Secondary | ICD-10-CM | POA: Insufficient documentation

## 2013-06-07 DIAGNOSIS — Y9389 Activity, other specified: Secondary | ICD-10-CM | POA: Diagnosis not present

## 2013-06-07 DIAGNOSIS — Z862 Personal history of diseases of the blood and blood-forming organs and certain disorders involving the immune mechanism: Secondary | ICD-10-CM | POA: Insufficient documentation

## 2013-06-07 DIAGNOSIS — Y990 Civilian activity done for income or pay: Secondary | ICD-10-CM | POA: Diagnosis not present

## 2013-06-07 DIAGNOSIS — Z8679 Personal history of other diseases of the circulatory system: Secondary | ICD-10-CM | POA: Insufficient documentation

## 2013-06-07 DIAGNOSIS — Z8639 Personal history of other endocrine, nutritional and metabolic disease: Secondary | ICD-10-CM | POA: Insufficient documentation

## 2013-06-07 DIAGNOSIS — S29012A Strain of muscle and tendon of back wall of thorax, initial encounter: Secondary | ICD-10-CM

## 2013-06-07 MED ORDER — METAXALONE 800 MG PO TABS
800.0000 mg | ORAL_TABLET | Freq: Once | ORAL | Status: AC
  Administered 2013-06-07: 800 mg via ORAL
  Filled 2013-06-07: qty 1

## 2013-06-07 MED ORDER — IBUPROFEN 600 MG PO TABS: 600.0000 mg | ORAL_TABLET | Freq: Four times a day (QID) | ORAL | Status: DC | PRN

## 2013-06-07 MED ORDER — KETOROLAC TROMETHAMINE 30 MG/ML IJ SOLN
30.0000 mg | Freq: Once | INTRAMUSCULAR | Status: AC
  Administered 2013-06-07: 30 mg via INTRAVENOUS
  Filled 2013-06-07: qty 1

## 2013-06-07 MED ORDER — METAXALONE 800 MG PO TABS: 800.0000 mg | ORAL_TABLET | Freq: Three times a day (TID) | ORAL | Status: DC

## 2013-06-07 MED ORDER — HYDROCODONE-ACETAMINOPHEN 5-325 MG PO TABS: 1.0000 | ORAL_TABLET | Freq: Four times a day (QID) | ORAL | Status: DC | PRN

## 2013-06-07 NOTE — ED Notes (Signed)
PT. REPORTS RIGHT UPPER BACK PAIN AFTER LIFTING HEAVY BUCKET OF H2O WHILE AT WORK THIS EVENING .

## 2013-06-07 NOTE — ED Provider Notes (Signed)
Medical screening examination/treatment/procedure(s) were performed by non-physician practitioner and as supervising physician I was immediately available for consultation/collaboration.  Ahnna Dungan M Lynel Forester, MD 06/07/13 0348 

## 2013-06-07 NOTE — ED Provider Notes (Signed)
History    CSN: [id number] Arrival date & time 06/07/13  0009  None    Chief Complaint  Patient presents with  . Back Pain   (Consider location/radiation/quality/duration/timing/severity/associated sxs/prior Treatment) HPI Comments: Lifted a heavy bucket of water out of a container and twisted to dispos of it felt pain under R scapula This happened at work tonight about 30 minutes ago   Patient is a 32 y.o. female presenting with back pain. The history is provided by the patient.  Back Pain Location:  Thoracic spine Quality:  Aching Radiates to:  R shoulder Pain severity:  Moderate Timing:  Constant Chronicity:  New Context: lifting heavy objects   Associated symptoms: no fever    Past Medical History  Diagnosis Date  . Migraine   . Multiple thyroid nodules 2009   Past Surgical History  Procedure Laterality Date  . Cesarean section      2001 and 2006   Family History  Problem Relation Age of Onset  . Hypertension Mother   . Heart disease Mother     CHF  . Seizures Father   . Hyperlipidemia Father   . Cancer Neg Hx   . Kidney disease Neg Hx    History  Substance Use Topics  . Smoking status: Former Smoker -- 0.50 packs/day    Types: Cigarettes    Quit date: 03/17/2013  . Smokeless tobacco: Never Used  . Alcohol Use: No   OB History   Grav Para Term Preterm Abortions TAB SAB Ect Mult Living                 Review of Systems  Constitutional: Negative for fever and chills.  Respiratory: Negative for shortness of breath and wheezing.   Musculoskeletal: Positive for back pain.  All other systems reviewed and are negative.    Allergies  Review of patient's allergies indicates no known allergies.  Home Medications   Current Outpatient Rx  Name  Route  Sig  Dispense  Refill  . loratadine (CLARITIN) 10 MG tablet   Oral   Take 1 tablet (10 mg total) by mouth daily.   30 tablet   11   . HYDROcodone-acetaminophen (NORCO/VICODIN) 5-325 MG per tablet   Oral   Take 1 tablet by mouth every 6 (six) hours as needed for pain (sever pain).   30 tablet   0   . ibuprofen (ADVIL,MOTRIN) 600 MG tablet   Oral   Take 1 tablet (600 mg total) by mouth every 6 (six) hours as needed for pain.   30 tablet   0   . metaxalone (SKELAXIN) 800 MG tablet   Oral   Take 1 tablet (800 mg total) by mouth 3 (three) times daily.   15 tablet   0    BP 117/79  Pulse 79  Temp(Src) 98.5 F (36.9 C) (Oral)  Resp 18  SpO2 100%  LMP 05/25/2013 Physical Exam  Nursing note and vitals reviewed. Constitutional: She is oriented to person, place, and time. She appears well-developed and well-nourished.  HENT:  Head: Normocephalic.  Neck: Normal range of motion.  Cardiovascular: Normal rate and regular rhythm.   Pulmonary/Chest: Effort normal and breath sounds normal.  Musculoskeletal: She exhibits tenderness.       Back:  Muscle spasm   Neurological: She is alert and oriented to person, place, and time.  Skin: Skin is warm. No rash noted.    ED Course  Procedures (including critical care time) Labs Reviewed - No  data to display No results found. 1. Strain of thoracic paraspinal muscles excluding T1 and T2 levels, initial encounter     MDM  Sever muscle spasm   Arman Filter, NP 06/07/13 [phone]

## 2013-06-16 ENCOUNTER — Other Ambulatory Visit: Payer: Self-pay | Admitting: Occupational Medicine

## 2013-06-16 ENCOUNTER — Ambulatory Visit: Payer: Self-pay

## 2013-06-16 DIAGNOSIS — R52 Pain, unspecified: Secondary | ICD-10-CM

## 2013-06-20 ENCOUNTER — Encounter: Payer: Self-pay | Admitting: Family

## 2013-06-20 ENCOUNTER — Ambulatory Visit (INDEPENDENT_AMBULATORY_CARE_PROVIDER_SITE_OTHER): Payer: 59 | Admitting: Family

## 2013-06-20 VITALS — BP 120/84 | HR 93 | Temp 98.5°F | Resp 16 | Wt 212.1 lb

## 2013-06-20 DIAGNOSIS — S46919A Strain of unspecified muscle, fascia and tendon at shoulder and upper arm level, unspecified arm, initial encounter: Secondary | ICD-10-CM | POA: Insufficient documentation

## 2013-06-20 DIAGNOSIS — S46911A Strain of unspecified muscle, fascia and tendon at shoulder and upper arm level, right arm, initial encounter: Secondary | ICD-10-CM

## 2013-06-20 DIAGNOSIS — IMO0002 Reserved for concepts with insufficient information to code with codable children: Secondary | ICD-10-CM

## 2013-06-20 MED ORDER — LIDOCAINE 5 % EX PTCH: 1.0000 | MEDICATED_PATCH | CUTANEOUS | Status: DC

## 2013-06-20 MED ORDER — METAXALONE 800 MG PO TABS: 800.0000 mg | ORAL_TABLET | Freq: Three times a day (TID) | ORAL | Status: DC

## 2013-06-20 NOTE — Progress Notes (Signed)
Subjective:    Patient ID: Elisha Headland, female    DOB: Aug 06, 1981, 32 y.o.   MRN: [id number]  HPI  Ms. Lanagan is a 32 yr old female who presents today for ED follow up of her thoracic strain. Reports that injury occurred at work on 7/8. She was seen in the [facility] ED on 7/9 and was treated with hydrocodone and skelaxin.  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 Reached down to pick up a bucket filled with water.  Felt a "pull."  Reports that it "hurt to breath in and out." reported injury to her manage.  Pain is located in the right upper back and shoulder.  Pain is worsened by movement.  Pain is improved by heat.  Pain medication "just makes me hot" did not relieve pain. Notes some improvement with the skelaxin.      Review of Systems See HPI  Past Medical History  Diagnosis Date  . Migraine   . Multiple thyroid nodules 2009    History   Social History  . Marital Status: Married    Spouse Name: N/A    Number of Children: N/A  . Years of Education: N/A   Occupational History  . Not on file.   Social History Main Topics  . Smoking status: Former Smoker -- 0.50 packs/day    Types: Cigarettes    Quit date: 03/17/2013  . Smokeless tobacco: Never Used  . Alcohol Use: No  . Drug Use: No  . Sexually Active: Not on file   Other Topics Concern  . Not on file   Social History Narrative   2 children   Single   Works in Training and development officer at [employer]   Rare ETOH   Denies drug use   Has good support team in the area of family/friends   Enjoys travelling.           Past Surgical History  Procedure Laterality Date  . Cesarean section      2001 and 2006    Family History  Problem Relation Age of Onset  . Hypertension Mother   . Heart disease Mother     CHF  . Seizures Father   . Hyperlipidemia Father   . Cancer Neg Hx   . Kidney disease Neg Hx     No Known Allergies  Current Outpatient Prescriptions on File Prior to Visit  Medication Sig Dispense  Refill  . HYDROcodone-acetaminophen (NORCO/VICODIN) 5-325 MG per tablet Take 1 tablet by mouth every 6 (six) hours as needed for pain (sever pain).  30 tablet  0  . ibuprofen (ADVIL,MOTRIN) 600 MG tablet Take 1 tablet (600 mg total) by mouth every 6 (six) hours as needed for pain.  30 tablet  0  . loratadine (CLARITIN) 10 MG tablet Take 1 tablet (10 mg total) by mouth daily.  30 tablet  11  . metaxalone (SKELAXIN) 800 MG tablet Take 1 tablet (800 mg total) by mouth 3 (three) times daily.  15 tablet  0   No current facility-administered medications on file prior to visit.    BP 120/84  Pulse 93  Temp(Src) 98.5 F (36.9 C) (Oral)  Resp 16  Wt 212 lb 1.9 oz (96.217 kg)  BMI 36.39 kg/m2  SpO2 99%  LMP 05/25/2013       Objective:   Physical Exam  Constitutional: She is oriented to person, place, and time. She appears well-developed and well-nourished. No distress.  Musculoskeletal:       Cervical back:  She exhibits tenderness.       Thoracic back: She exhibits tenderness.  Some mild swelling of the right hand.   R shoulder is tender to the touch.  Unable to fully extend right arm.  Unable to complete empty can on right.   Neurological: She is alert and oriented to person, place, and time.  Psychiatric: She has a normal mood and affect. Her behavior is normal. Judgment and thought content normal.  head: [place]/AT Skin: warm dry        Assessment & Plan:

## 2013-06-20 NOTE — Assessment & Plan Note (Addendum)
I am concerned about possible rotator cuff tear. Recommend that she meet with Dr. Pearletha [facility] sports medicine for further evaluation. Lidocaine patch and skelaxin for pain.

## 2013-06-20 NOTE — Patient Instructions (Addendum)
You will be contacted about your referral to sports medicine. Follow up with [place] as needed.

## 2013-06-22 ENCOUNTER — Ambulatory Visit: Payer: Worker's Compensation | Admitting: Family Medicine

## 2013-06-23 ENCOUNTER — Ambulatory Visit: Payer: 59 | Admitting: Family

## 2013-06-23 ENCOUNTER — Encounter: Payer: Self-pay | Admitting: Family Medicine

## 2013-06-23 ENCOUNTER — Ambulatory Visit (INDEPENDENT_AMBULATORY_CARE_PROVIDER_SITE_OTHER): Payer: Worker's Compensation | Admitting: Family Medicine

## 2013-06-23 VITALS — BP 125/84 | HR 83 | Ht 62.0 in | Wt 212.0 lb

## 2013-06-23 DIAGNOSIS — M542 Cervicalgia: Secondary | ICD-10-CM

## 2013-06-23 DIAGNOSIS — Z0289 Encounter for other administrative examinations: Secondary | ICD-10-CM

## 2013-06-23 MED ORDER — DIAZEPAM 5 MG PO TABS: 5.0000 mg | ORAL_TABLET | Freq: Three times a day (TID) | ORAL | Status: DC | PRN

## 2013-06-23 MED ORDER — HYDROCODONE-ACETAMINOPHEN 5-325 MG PO TABS: 1.0000 | ORAL_TABLET | Freq: Four times a day (QID) | ORAL | Status: DC | PRN

## 2013-06-23 NOTE — Patient Instructions (Addendum)
Take hydrocodone and valium as needed. We will go ahead with mri of cervical spine. Out of work in meantime.

## 2013-06-26 ENCOUNTER — Encounter: Payer: Self-pay | Admitting: Family Medicine

## 2013-06-26 DIAGNOSIS — M542 Cervicalgia: Secondary | ICD-10-CM | POA: Insufficient documentation

## 2013-06-26 NOTE — Progress Notes (Signed)
Patient ID: Kayla Jenkins, female   DOB: [birth year], 32 y.o.   MRN: [id number]  PCP: Lemont Fillers., NP  Subjective:   HPI: Patient is a 32 y.o. female here for right shoulder, upper back pain.  Patient reports on 7/8 she was at work - pulled a mop bucket off cart that was full of water and felt a pull in right posterior shoulder, neck region. Immediate pain causing her to drop the bucket. Since than developed swelling down into right hand with numbness, tingling, weakness in arm. Limited ROM of shoulder due to pain and pain with neck motions. Pain radiates from neck down into fingers. Tried toradol, skelaxin, norco, ibuprofen, lidoderm patches, prednisone dose pack - all without much benefit. No prior neck issues. No bowel/bladder dysfunction.  Past Medical History  Diagnosis Date  . Migraine   . Multiple thyroid nodules 2009    Current Outpatient Prescriptions on File Prior to Visit  Medication Sig Dispense Refill  . ibuprofen (ADVIL,MOTRIN) 600 MG tablet Take 1 tablet (600 mg total) by mouth every 6 (six) hours as needed for pain.  30 tablet  0  . lidocaine (LIDODERM) 5 % Place 1 patch onto the skin daily. Remove & Discard patch within 12 hours or as directed by MD  15 patch  0  . loratadine (CLARITIN) 10 MG tablet Take 1 tablet (10 mg total) by mouth daily.  30 tablet  11   No current facility-administered medications on file prior to visit.    Past Surgical History  Procedure Laterality Date  . Cesarean section      2001 and 2006    No Known Allergies  History   Social History  . Marital Status: Married    Spouse Name: N/A    Number of Children: N/A  . Years of Education: N/A   Occupational History  . Not on file.   Social History Main Topics  . Smoking status: Former Smoker -- 0.50 packs/day    Types: Cigarettes    Quit date: 03/17/2013  . Smokeless tobacco: Never Used  . Alcohol Use: No  . Drug Use: No  . Sexually Active: Not on file   Other  Topics Concern  . Not on file   Social History Narrative   2 children   Single   Works in Training and development officer at [employer]   Rare ETOH   Denies drug use   Has good support team in the area of family/friends   Enjoys travelling.           Family History  Problem Relation Age of Onset  . Hypertension Mother   . Heart disease Mother     CHF  . Hyperlipidemia Mother   . Seizures Father   . Hyperlipidemia Father   . Cancer Neg Hx   . Kidney disease Neg Hx   . Diabetes Neg Hx     BP 125/84  Pulse 83  Ht 5\' 2"  (1.575 m)  Wt 212 lb (96.163 kg)  BMI 38.77 kg/m2  LMP 05/25/2013  Review of Systems: See HPI above.    Objective:  Physical Exam:  Gen: NAD  Neck: No gross deformity, swelling, bruising. TTP right cervical paraspinal region, medial to scapula as well.  No midline/bony TTP. FROM neck - pain on right lateral rotation, flexion and extension. Strength with right finger abduction, triceps extension 4/5.  Otherwise BUE strength 5/5.   Sensation diminished to light touch right arm but no specific dermatomal distribution.   2+  equal reflexes in triceps, biceps, brachioradialis tendons. Negative spurlings.  R shoulder: No swelling, ecchymoses.  No gross deformity. No TTP AC or biceps tendon. FROM with painful arc. Negative Hawkins, Neers. Negative Speeds, Yergasons. Strength 5/5 with empty can and resisted internal/external rotation.  Pain with all motions but felt more posterior. Negative apprehension.    MSK u/s:  No obvious full thickness tears of supraspinatus, subscapularis, infraspinatus.  Assessment & Plan:  1. Right neck/shoulder pain - Patient has some elements of both radiculopathy and rotator cuff strain but predominant symptoms (numbness, tingling, posterior shoulder pain radiating into right arm and fingers, weakness) better explained by radiculopathy, disc herniation or brachial plexopathy.  She has had extensive treatment to date without improvement  including prednisone dose pack, toradol, skelaxin, lidoderm, norco, ibuprofen without relief.  Will move forward with MRI to assess for large disc herniation.  Take hydrocodone and valium as needed. We will go ahead with mri of cervical spine. Out of work in meantime.

## 2013-06-26 NOTE — Assessment & Plan Note (Signed)
Patient has some elements of both radiculopathy and rotator cuff strain but predominant symptoms (numbness, tingling, posterior shoulder pain radiating into right arm and fingers, weakness) better explained by radiculopathy, disc herniation or brachial plexopathy.  She has had extensive treatment to date without improvement including prednisone dose pack, toradol, skelaxin, lidoderm, norco, ibuprofen without relief.  Will move forward with MRI to assess for large disc herniation.

## 2013-06-27 ENCOUNTER — Ambulatory Visit (HOSPITAL_BASED_OUTPATIENT_CLINIC_OR_DEPARTMENT_OTHER): Payer: Worker's Compensation

## 2013-07-01 ENCOUNTER — Ambulatory Visit (HOSPITAL_BASED_OUTPATIENT_CLINIC_OR_DEPARTMENT_OTHER)
Admission: RE | Admit: 2013-07-01 | Discharge: 2013-07-01 | Disposition: A | Discharge status: Home / Self Care | Payer: PRIVATE HEALTH INSURANCE | Source: Ambulatory Visit | Attending: Family Medicine | Admitting: Family Medicine

## 2013-07-01 DIAGNOSIS — M542 Cervicalgia: Secondary | ICD-10-CM | POA: Insufficient documentation

## 2013-07-01 DIAGNOSIS — X58XXXA Exposure to other specified factors, initial encounter: Secondary | ICD-10-CM | POA: Insufficient documentation

## 2013-07-01 DIAGNOSIS — S0993XA Unspecified injury of face, initial encounter: Secondary | ICD-10-CM | POA: Insufficient documentation

## 2013-07-01 DIAGNOSIS — R209 Unspecified disturbances of skin sensation: Secondary | ICD-10-CM | POA: Insufficient documentation

## 2013-07-03 ENCOUNTER — Encounter: Payer: Self-pay | Admitting: Family Medicine

## 2013-07-03 ENCOUNTER — Ambulatory Visit (INDEPENDENT_AMBULATORY_CARE_PROVIDER_SITE_OTHER): Payer: Worker's Compensation | Admitting: Family Medicine

## 2013-07-03 VITALS — BP 118/77 | HR 99 | Ht 62.0 in | Wt 212.0 lb

## 2013-07-03 DIAGNOSIS — M542 Cervicalgia: Secondary | ICD-10-CM

## 2013-07-03 MED ORDER — NORTRIPTYLINE HCL 25 MG PO CAPS: 25.0000 mg | ORAL_CAPSULE | Freq: Every day | ORAL | Status: DC

## 2013-07-03 NOTE — Patient Instructions (Addendum)
Your MRI of your neck was normal except for muscle spasms which should have responded to the conservative treatment. We will refer you to neurology for evaluation for possible brachial plexopathy or inflammatory neuropathy.

## 2013-07-04 ENCOUNTER — Encounter: Payer: Self-pay | Admitting: Family Medicine

## 2013-07-04 NOTE — Assessment & Plan Note (Signed)
MRI shows only evidence of muscle spasms but no disc herniation, nerve root avulsion in windows of the MRI.  Suspect given pain level that she may have a neurologic issue, possible brachial plexopathy or stretch injury distal to MRI windows.  Advised we move forward with neurology referral for further evaluation.  Out of work for 2 weeks until she is able to see them.  Will trial nortriptyline at bedtime.

## 2013-07-04 NOTE — Progress Notes (Signed)
Patient ID: Kayla Jenkins, female   DOB: 28-Apr-1981, 32 y.o.   MRN: [id number]  PCP: Lemont Fillers., NP  Subjective:   HPI: Patient is a 32 y.o. female here for f/u right shoulder, upper back pain.  7/25: Patient reports on 7/8 she was at work - pulled a mop bucket off cart that was full of water and felt a pull in right posterior shoulder, neck region. Immediate pain causing her to drop the bucket. Since than developed swelling down into right hand with numbness, tingling, weakness in arm. Limited ROM of shoulder due to pain and pain with neck motions. Pain radiates from neck down into fingers. Tried toradol, skelaxin, norco, ibuprofen, lidoderm patches, prednisone dose pack - all without much benefit. No prior neck issues. No bowel/bladder dysfunction.  8/4: Patient returns for MRI results. She continues to have 10/10 pain in right side of neck radiating into right arm. Associated tingling, swelling. Having muscle spasms as well.  Past Medical History  Diagnosis Date  . Migraine   . Multiple thyroid nodules 2009    Current Outpatient Prescriptions on File Prior to Visit  Medication Sig Dispense Refill  . diazepam (VALIUM) 5 MG tablet Take 1 tablet (5 mg total) by mouth every 8 (eight) hours as needed for anxiety.  60 tablet  0  . HYDROcodone-acetaminophen (NORCO/VICODIN) 5-325 MG per tablet Take 1 tablet by mouth every 6 (six) hours as needed for pain (sever pain).  60 tablet  0  . ibuprofen (ADVIL,MOTRIN) 600 MG tablet Take 1 tablet (600 mg total) by mouth every 6 (six) hours as needed for pain.  30 tablet  0  . lidocaine (LIDODERM) 5 % Place 1 patch onto the skin daily. Remove & Discard patch within 12 hours or as directed by MD  15 patch  0  . loratadine (CLARITIN) 10 MG tablet Take 1 tablet (10 mg total) by mouth daily.  30 tablet  11   No current facility-administered medications on file prior to visit.    Past Surgical History  Procedure Laterality Date  .  Cesarean section      2001 and 2006    No Known Allergies  History   Social History  . Marital Status: Married    Spouse Name: N/A    Number of Children: N/A  . Years of Education: N/A   Occupational History  . Not on file.   Social History Main Topics  . Smoking status: Former Smoker    Types: Cigarettes    Quit date: 03/17/2013  . Smokeless tobacco: Never Used  . Alcohol Use: No  . Drug Use: No  . Sexually Active: Not on file   Other Topics Concern  . Not on file   Social History Narrative   2 children   Single   Works in Training and development officer at [employer]   Rare ETOH   Denies drug use   Has good support team in the area of family/friends   Enjoys travelling.           Family History  Problem Relation Age of Onset  . Hypertension Mother   . Heart disease Mother     CHF  . Hyperlipidemia Mother   . Seizures Father   . Hyperlipidemia Father   . Cancer Neg Hx   . Kidney disease Neg Hx   . Diabetes Neg Hx     BP 118/77  Pulse 99  Ht 5\' 2"  (1.575 m)  Wt 212 lb (96.163 kg)  BMI 38.77 kg/m2  LMP 05/25/2013  Review of Systems: See HPI above.    Objective:  Physical Exam:  Gen: NAD Exam not repeated today. Neck: No gross deformity, swelling, bruising. TTP right cervical paraspinal region, medial to scapula as well.  No midline/bony TTP. FROM neck - pain on right lateral rotation, flexion and extension. Strength with right finger abduction, triceps extension 4/5.  Otherwise BUE strength 5/5.   Sensation diminished to light touch right arm but no specific dermatomal distribution.   2+ equal reflexes in triceps, biceps, brachioradialis tendons. Negative spurlings.  R shoulder: No swelling, ecchymoses.  No gross deformity. No TTP AC or biceps tendon. FROM with painful arc. Negative Hawkins, Neers. Negative Speeds, Yergasons. Strength 5/5 with empty can and resisted internal/external rotation.  Pain with all motions but felt more posterior. Negative  apprehension.    MSK u/s:  No obvious full thickness tears of supraspinatus, subscapularis, infraspinatus.  Assessment & Plan:  1. Right neck/shoulder pain - MRI shows only evidence of muscle spasms but no disc herniation, nerve root avulsion in windows of the MRI.  Suspect given pain level that she may have a neurologic issue, possible brachial plexopathy or stretch injury distal to MRI windows.  Advised we move forward with neurology referral for further evaluation.  Out of work for 2 weeks until she is able to see them.  Will trial nortriptyline at bedtime.

## 2013-07-07 ENCOUNTER — Ambulatory Visit: Payer: Self-pay | Admitting: Family Medicine

## 2013-07-17 ENCOUNTER — Ambulatory Visit (INDEPENDENT_AMBULATORY_CARE_PROVIDER_SITE_OTHER): Payer: Worker's Compensation | Admitting: Family Medicine

## 2013-07-17 ENCOUNTER — Encounter: Payer: Self-pay | Admitting: Family Medicine

## 2013-07-17 VITALS — BP 110/77 | HR 99 | Ht 62.0 in | Wt 212.0 lb

## 2013-07-17 DIAGNOSIS — M79609 Pain in unspecified limb: Secondary | ICD-10-CM

## 2013-07-17 DIAGNOSIS — M542 Cervicalgia: Secondary | ICD-10-CM

## 2013-07-17 DIAGNOSIS — M79604 Pain in right leg: Secondary | ICD-10-CM

## 2013-07-17 MED ORDER — NORTRIPTYLINE HCL 75 MG PO CAPS: 75.0000 mg | ORAL_CAPSULE | Freq: Every day | ORAL | Status: DC

## 2013-07-17 MED ORDER — DIAZEPAM 5 MG PO TABS: 5.0000 mg | ORAL_TABLET | Freq: Three times a day (TID) | ORAL | Status: DC | PRN

## 2013-07-17 NOTE — Patient Instructions (Addendum)
We are still waiting on worker's comp to approve your neurology appointment. Take 2 of your nortriptyline you currently have at bedtime until you run out. AFTER finishing this start taking the new nortriptyline prescription (75mg  at bedtime). Valium as needed for spasms - if you require more after this we'll have to step you down to a different muscle relaxant. Follow up depends on what neurology has to say about diagnosis and further treatment.

## 2013-07-19 ENCOUNTER — Ambulatory Visit (HOSPITAL_BASED_OUTPATIENT_CLINIC_OR_DEPARTMENT_OTHER)
Admission: RE | Admit: 2013-07-19 | Discharge: 2013-07-19 | Disposition: A | Discharge status: Home / Self Care | Payer: 59 | Source: Ambulatory Visit | Attending: Family Medicine | Admitting: Family Medicine

## 2013-07-19 ENCOUNTER — Ambulatory Visit (INDEPENDENT_AMBULATORY_CARE_PROVIDER_SITE_OTHER): Payer: Worker's Compensation | Admitting: Family Medicine

## 2013-07-19 ENCOUNTER — Encounter: Payer: Self-pay | Admitting: Family Medicine

## 2013-07-19 VITALS — BP 113/81 | HR 108 | Ht 62.0 in | Wt 212.0 lb

## 2013-07-19 DIAGNOSIS — M79604 Pain in right leg: Secondary | ICD-10-CM

## 2013-07-19 DIAGNOSIS — M79609 Pain in unspecified limb: Secondary | ICD-10-CM | POA: Insufficient documentation

## 2013-07-19 DIAGNOSIS — M7989 Other specified soft tissue disorders: Secondary | ICD-10-CM | POA: Insufficient documentation

## 2013-07-24 ENCOUNTER — Encounter: Payer: Self-pay | Admitting: Family Medicine

## 2013-07-24 DIAGNOSIS — M79604 Pain in right leg: Secondary | ICD-10-CM | POA: Insufficient documentation

## 2013-07-24 NOTE — Assessment & Plan Note (Signed)
Her description of pain, distribution, onset does not fit with specific pathology though it is possible she has radiculopathy presenting in an unusual fashion.  She states this happened with the initial injury but pain did not occur immediately following this and was not reported to ED physician, PCP, or me - she may have set of a neurologic disorder of some kind which is why it's important she get the neurology referral approved for right upper extremity and now right lower extremity issues - likely to have NCVs/EMGs to further assess these issues.  In meantime I will increase her dose of nortriptyline and add muscle relaxant.  She continues to be out of work pending neurology evaluation.

## 2013-07-24 NOTE — Progress Notes (Signed)
Patient ID: Kayla Jenkins, female   DOB: [birth year], 32 y.o.   MRN: [id number]  PCP: Lemont Fillers., NP  Subjective:   HPI: Patient is a 32 y.o. female here for right leg pain, previously seen for shoulder and neck pain.  7/25: Patient reports on 7/8 she was at work - pulled a mop bucket off cart that was full of water and felt a pull in right posterior shoulder, neck region. Immediate pain causing her to drop the bucket. Since than developed swelling down into right hand with numbness, tingling, weakness in arm. Limited ROM of shoulder due to pain and pain with neck motions. Pain radiates from neck down into fingers. Tried toradol, skelaxin, norco, ibuprofen, lidoderm patches, prednisone dose pack - all without much benefit. No prior neck issues. No bowel/bladder dysfunction.  8/4: Patient returns for MRI results. She continues to have 10/10 pain in right side of neck radiating into right arm. Associated tingling, swelling. Having muscle spasms as well.  8/18: Patient is taking nortriptyline 25mg  qhs which has helped some with sleep. Takes hydrocodone as well as needed for pain. She comes in today stating she's getting pain in her right leg laterally associated with swelling. She states this occurred with her initial injury though this is the first she's reporting any lower extremity issues to me, her PCP, or the ED physician. Per her report the pain started about a week ago down to her foot. Has some pain in back and hurts when she has a bowel movement though can control bowel/bladder function. No pain in left leg.  Past Medical History  Diagnosis Date  . Migraine   . Multiple thyroid nodules 2009    Current Outpatient Prescriptions on File Prior to Visit  Medication Sig Dispense Refill  . HYDROcodone-acetaminophen (NORCO/VICODIN) 5-325 MG per tablet Take 1 tablet by mouth every 6 (six) hours as needed for pain (sever pain).  60 tablet  0  . ibuprofen (ADVIL,MOTRIN)  600 MG tablet Take 1 tablet (600 mg total) by mouth every 6 (six) hours as needed for pain.  30 tablet  0  . lidocaine (LIDODERM) 5 % Place 1 patch onto the skin daily. Remove & Discard patch within 12 hours or as directed by MD  15 patch  0  . loratadine (CLARITIN) 10 MG tablet Take 1 tablet (10 mg total) by mouth daily.  30 tablet  11   No current facility-administered medications on file prior to visit.    Past Surgical History  Procedure Laterality Date  . Cesarean section      2001 and 2006    No Known Allergies  History   Social History  . Marital Status: Married    Spouse Name: N/A    Number of Children: N/A  . Years of Education: N/A   Occupational History  . Not on file.   Social History Main Topics  . Smoking status: Former Smoker    Types: Cigarettes    Quit date: 03/17/2013  . Smokeless tobacco: Never Used  . Alcohol Use: No  . Drug Use: No  . Sexual Activity: Not on file   Other Topics Concern  . Not on file   Social History Narrative   2 children   Single   Works in Training and development officer at [employer]   Rare ETOH   Denies drug use   Has good support team in the area of family/friends   Enjoys travelling.  Family History  Problem Relation Age of Onset  . Hypertension Mother   . Heart disease Mother     CHF  . Hyperlipidemia Mother   . Seizures Father   . Hyperlipidemia Father   . Cancer Neg Hx   . Kidney disease Neg Hx   . Diabetes Neg Hx     BP 110/77  Pulse 99  Ht 5\' 2"  (1.575 m)  Wt 212 lb (96.163 kg)  BMI 38.77 kg/m2  Review of Systems: See HPI above.    Objective:  Physical Exam:  Gen: NAD Back/R leg: No gross deformity, scoliosis. TTP Right paraspinal lumbar region, buttock, throughout right leg.  No midline or bony TTP. FROM of back. Strength LEs 5/5 all muscle groups.   2+ MSRs in patellar and achilles tendons, equal bilaterally. Negative SLRs. Sensation intact to light touch bilaterally. Negative logroll  bilateral hips Negative fabers and piriformis stretches.   Assessment & Plan:  1. Right leg/low back pain - Her description of pain, distribution, onset does not fit with specific pathology though it is possible she has radiculopathy presenting in an unusual fashion.  She states this happened with the initial injury but pain did not occur immediately following this and was not reported to ED physician, PCP, or me - she may have set of a neurologic disorder of some kind which is why it's important she get the neurology referral approved for right upper extremity and now right lower extremity issues - likely to have NCVs/EMGs to further assess these issues.  In meantime I will increase her dose of nortriptyline and add muscle relaxant.  She continues to be out of work pending neurology evaluation.

## 2013-07-25 ENCOUNTER — Encounter: Payer: Self-pay | Admitting: Family Medicine

## 2013-07-25 NOTE — Progress Notes (Signed)
Patient ID: Kayla Jenkins, female   DOB: 03-06-1981, 32 y.o.   MRN: [id number]  PCP: Kayla Jenkins., NP  Subjective:   HPI: Patient is a 32 y.o. female here for right leg pain, previously seen for shoulder and neck pain.  7/25: Patient reports on 7/8 she was at work - pulled a mop bucket off cart that was full of water and felt a pull in right posterior shoulder, neck region. Immediate pain causing her to drop the bucket. Since than developed swelling down into right hand with numbness, tingling, weakness in arm. Limited ROM of shoulder due to pain and pain with neck motions. Pain radiates from neck down into fingers. Tried toradol, skelaxin, norco, ibuprofen, lidoderm patches, prednisone dose pack - all without much benefit. No prior neck issues. No bowel/bladder dysfunction.  8/4: Patient returns for MRI results. She continues to have 10/10 pain in right side of neck radiating into right arm. Associated tingling, swelling. Having muscle spasms as well.  8/18: Patient is taking nortriptyline 25mg  qhs which has helped some with sleep. Takes hydrocodone as well as needed for pain. She comes in today stating she's getting pain in her right leg laterally associated with swelling. She states this occurred with her initial injury though this is the first she's reporting any lower extremity issues to me, her PCP, or the ED physician. Per her report the pain started about a week ago down to her foot. Has some pain in back and hurts when she has a bowel movement though can control bowel/bladder function. No pain in left leg.  8/20: Patient returned today complaining of swelling, bruising to her right leg medially. Doesn't recall bumping her leg on anything. Sore to the touch throughout inside of right leg.  Past Medical History  Diagnosis Date  . Migraine   . Multiple thyroid nodules 2009    Current Outpatient Prescriptions on File Prior to Visit  Medication Sig Dispense  Refill  . diazepam (VALIUM) 5 MG tablet Take 1 tablet (5 mg total) by mouth every 8 (eight) hours as needed for anxiety.  60 tablet  0  . HYDROcodone-acetaminophen (NORCO/VICODIN) 5-325 MG per tablet Take 1 tablet by mouth every 6 (six) hours as needed for pain (sever pain).  60 tablet  0  . ibuprofen (ADVIL,MOTRIN) 600 MG tablet Take 1 tablet (600 mg total) by mouth every 6 (six) hours as needed for pain.  30 tablet  0  . lidocaine (LIDODERM) 5 % Place 1 patch onto the skin daily. Remove & Discard patch within 12 hours or as directed by MD  15 patch  0  . loratadine (CLARITIN) 10 MG tablet Take 1 tablet (10 mg total) by mouth daily.  30 tablet  11  . nortriptyline (PAMELOR) 75 MG capsule Take 1 capsule (75 mg total) by mouth at bedtime.  30 capsule  1   No current facility-administered medications on file prior to visit.    Past Surgical History  Procedure Laterality Date  . Cesarean section      2001 and 2006    No Known Allergies  History   Social History  . Marital Status: Married    Spouse Name: N/A    Number of Children: N/A  . Years of Education: N/A   Occupational History  . Not on file.   Social History Main Topics  . Smoking status: Former Smoker    Types: Cigarettes    Quit date: 03/17/2013  . Smokeless tobacco: Never Used  .  Alcohol Use: No  . Drug Use: No  . Sexual Activity: Not on file   Other Topics Concern  . Not on file   Social History Narrative   2 children   Single   Works in Training and development officer at [employer]   Rare ETOH   Denies drug use   Has good support team in the area of family/friends   Enjoys travelling.           Family History  Problem Relation Age of Onset  . Hypertension Mother   . Heart disease Mother     CHF  . Hyperlipidemia Mother   . Seizures Father   . Hyperlipidemia Father   . Cancer Neg Hx   . Kidney disease Neg Hx   . Diabetes Neg Hx     BP 113/81  Pulse 108  Ht 5\' 2"  (1.575 m)  Wt 212 lb (96.163 kg)  BMI  38.77 kg/m2  LMP 07/17/2013  Review of Systems: See HPI above.    Objective:  Physical Exam:  Gen: NAD R leg: Bruise just above knee joint medially.  Questionable swelling compared to left leg.  No other bruising, deformity. Superficial TTP Throughout right leg medially to about mid-lower leg.  Negative logroll of hip.  Pain not reproduced with knee motions. Collateral ligaments intact of knee.  No palpable cords.   Assessment & Plan:  1. Right leg pain - Doppler u/s performed to ensure she's not developing a DVT given her complaints of increasing pain, swelling in right leg.  This was negative.  This is most likely related to her other pain in right leg but could also represent developing superficial thrombophlebitis.  Will trial compression stockings, encouraged nsaids and heat as well.  Still awaiting neurology referral approval.

## 2013-07-25 NOTE — Assessment & Plan Note (Signed)
Doppler u/s performed to ensure she's not developing a DVT given her complaints of increasing pain, swelling in right leg.  This was negative.  This is most likely related to her other pain in right leg but could also represent developing superficial thrombophlebitis.  Will trial compression stockings, encouraged nsaids and heat as well.  Still awaiting neurology referral approval.

## 2013-08-10 ENCOUNTER — Telehealth: Payer: Self-pay | Admitting: Family Medicine

## 2013-08-10 ENCOUNTER — Other Ambulatory Visit: Payer: Self-pay | Admitting: *Deleted

## 2013-08-10 DIAGNOSIS — M542 Cervicalgia: Secondary | ICD-10-CM

## 2013-08-10 MED ORDER — HYDROCODONE-ACETAMINOPHEN 5-325 MG PO TABS: 1.0000 | ORAL_TABLET | Freq: Four times a day (QID) | ORAL | Status: DC | PRN

## 2013-08-10 NOTE — Telephone Encounter (Signed)
Can you get the report from neurology?

## 2013-08-10 NOTE — Telephone Encounter (Signed)
Neuro referral to be scanned in the chart.  Ok for one additional script of norco 5/325 1 tab every 6 hours as needed for severe pain, #60, 0 refills until she complete neuro evaluation.

## 2013-08-10 NOTE — Telephone Encounter (Signed)
Spoke with patient and is still taking the Nortriptyline at bedtime. Is out of pain medication. Went to neuro appointment which was on 08-04-13.

## 2013-08-10 NOTE — Telephone Encounter (Signed)
What's the status of her neurology referral?  Is she taking the nortriptyline?

## 2013-08-29 ENCOUNTER — Ambulatory Visit (INDEPENDENT_AMBULATORY_CARE_PROVIDER_SITE_OTHER): Payer: Worker's Compensation | Admitting: Family Medicine

## 2013-08-29 ENCOUNTER — Encounter: Payer: Self-pay | Admitting: Family Medicine

## 2013-08-29 VITALS — BP 141/98 | HR 114 | Ht 62.0 in | Wt 216.0 lb

## 2013-08-29 DIAGNOSIS — M542 Cervicalgia: Secondary | ICD-10-CM

## 2013-08-29 MED ORDER — GABAPENTIN 300 MG PO CAPS: 300.0000 mg | ORAL_CAPSULE | Freq: Three times a day (TID) | ORAL | Status: DC

## 2013-08-29 NOTE — Patient Instructions (Signed)
Continue the nortriptyline 75mg  at bedtime. Start neurontin.  Take 300mg  at bedtime for 3 days then 300mg  twice a day.  Then finally 300mg  three times a day. Continue muscle relaxant, hydrocodone while we're waiting on approval for the other studies.

## 2013-08-30 ENCOUNTER — Telehealth: Payer: Self-pay | Admitting: Family

## 2013-08-30 ENCOUNTER — Encounter: Payer: Self-pay | Admitting: Family Medicine

## 2013-08-30 DIAGNOSIS — M25519 Pain in unspecified shoulder: Secondary | ICD-10-CM

## 2013-08-30 NOTE — Telephone Encounter (Signed)
Please call pt and let her know that I reviewed her note from Dr. Pearletha Forge. He is recommending referral to neurology.  I will arrange.  (pended below).

## 2013-08-30 NOTE — Progress Notes (Signed)
Patient ID: Kayla Jenkins, female   DOB: Jan 23, 1981, 32 y.o.   MRN: [id number]  PCP: Lemont Fillers., NP  Subjective:   HPI: Patient is a 32 y.o. female here for shoulder and neck pain.  7/25: Patient reports on 7/8 she was at work - pulled a mop bucket off cart that was full of water and felt a pull in right posterior shoulder, neck region. Immediate pain causing her to drop the bucket. Since than developed swelling down into right hand with numbness, tingling, weakness in arm. Limited ROM of shoulder due to pain and pain with neck motions. Pain radiates from neck down into fingers. Tried toradol, skelaxin, norco, ibuprofen, lidoderm patches, prednisone dose pack - all without much benefit. No prior neck issues. No bowel/bladder dysfunction.  8/4: Patient returns for MRI results. She continues to have 10/10 pain in right side of neck radiating into right arm. Associated tingling, swelling. Having muscle spasms as well.  8/18: Patient is taking nortriptyline 25mg  qhs which has helped some with sleep. Takes hydrocodone as well as needed for pain. She comes in today stating she's getting pain in her right leg laterally associated with swelling. She states this occurred with her initial injury though this is the first she's reporting any lower extremity issues to me, her PCP, or the ED physician. Per her report the pain started about a week ago down to her foot. Has some pain in back and hurts when she has a bowel movement though can control bowel/bladder function. No pain in left leg.  8/20: Patient returned today complaining of swelling, bruising to her right leg medially. Doesn't recall bumping her leg on anything. Sore to the touch throughout inside of right leg.  9/30: Patient returns today with continued pain in neck and right shoulder. Also now complaining of left shoulder pain but this was not present following her accident. She has seen neurologist, note scanned  into chart, but no further testing has been approved by workers comp yet including NCVs/EMGs which are next step of her treatment. Continues to take nortriptyline 75mg  at bedtime, muscle relaxant from neurologist, and hydrocodone. Has been out of work.  Past Medical History  Diagnosis Date  . Migraine   . Multiple thyroid nodules 2009    Current Outpatient Prescriptions on File Prior to Visit  Medication Sig Dispense Refill  . diazepam (VALIUM) 5 MG tablet Take 1 tablet (5 mg total) by mouth every 8 (eight) hours as needed for anxiety.  60 tablet  0  . HYDROcodone-acetaminophen (NORCO/VICODIN) 5-325 MG per tablet Take 1 tablet by mouth every 6 (six) hours as needed for pain (sever pain).  60 tablet  0  . ibuprofen (ADVIL,MOTRIN) 600 MG tablet Take 1 tablet (600 mg total) by mouth every 6 (six) hours as needed for pain.  30 tablet  0  . lidocaine (LIDODERM) 5 % Place 1 patch onto the skin daily. Remove & Discard patch within 12 hours or as directed by MD  15 patch  0  . loratadine (CLARITIN) 10 MG tablet Take 1 tablet (10 mg total) by mouth daily.  30 tablet  11  . nortriptyline (PAMELOR) 75 MG capsule Take 1 capsule (75 mg total) by mouth at bedtime.  30 capsule  1   No current facility-administered medications on file prior to visit.    Past Surgical History  Procedure Laterality Date  . Cesarean section      2001 and 2006    No Known Allergies  History   Social History  . Marital Status: Married    Spouse Name: N/A    Number of Children: N/A  . Years of Education: N/A   Occupational History  . Not on file.   Social History Main Topics  . Smoking status: Current Every Day Smoker    Types: Cigarettes    Last Attempt to Quit: 03/17/2013  . Smokeless tobacco: Never Used     Comment: 3 cigarettes per day  . Alcohol Use: No  . Drug Use: No  . Sexual Activity: Not on file   Other Topics Concern  . Not on file   Social History Narrative   2 children   Single   Works  in Training and development officer at [employer]   Rare ETOH   Denies drug use   Has good support team in the area of family/friends   Enjoys travelling.           Family History  Problem Relation Age of Onset  . Hypertension Mother   . Heart disease Mother     CHF  . Hyperlipidemia Mother   . Seizures Father   . Hyperlipidemia Father   . Cancer Neg Hx   . Kidney disease Neg Hx   . Diabetes Neg Hx     BP 141/98  Pulse 114  Ht 5\' 2"  (1.575 m)  Wt 216 lb (97.977 kg)  BMI 39.5 kg/m2  Review of Systems: See HPI above.    Objective:  Physical Exam: Gen: NAD  Neck: No gross deformity, swelling, bruising. TTP right cervical paraspinal region, medial to scapula as well.  No midline/bony TTP. FROM neck - pain on right lateral rotation, flexion and extension. Strength with right finger abduction, triceps extension 4/5.  Thumb opposition 5-/5.  Otherwise BUE strength 5/5.   Sensation diminished to light touch right arm but no specific dermatomal distribution.   1+ equal reflexes in triceps, biceps, brachioradialis tendons. Negative spurlings.  R shoulder: No swelling, ecchymoses.  No gross deformity. No TTP AC or biceps tendon. FROM with painful arc. Negative Hawkins, Neers. Negative Speeds, Yergasons. Strength 5/5 with empty can and resisted internal/external rotation.  Pain with all motions but felt more posterior. Negative apprehension.   Assessment & Plan:  1. Neck/right shoulder pain - Ultrasound of shoulder without evidence rotator cuff tear, other pathology.  May have mild impingement but would not cause 10/10 pain, radiation into fingers with numbness, weakness in arm.  MRI neck without disc herniation.  Suspect brachial plexopathy but advised patient neurologist is the specialist she needs to see for this moving forward for evaluation with NCVs/EMGs, treatment.  In meantime will add neurontin to nortriptyline.

## 2013-08-30 NOTE — Assessment & Plan Note (Signed)
Ultrasound of shoulder without evidence rotator cuff tear, other pathology.  May have mild impingement but would not cause 10/10 pain, radiation into fingers with numbness, weakness in arm.  MRI neck without disc herniation.  Suspect brachial plexopathy but advised patient neurologist is the specialist she needs to see for this moving forward for evaluation with NCVs/EMGs, treatment.  In meantime will add neurontin to nortriptyline.

## 2013-08-31 NOTE — Telephone Encounter (Signed)
Notified pt and she voices understanding. 

## 2013-09-05 ENCOUNTER — Ambulatory Visit: Payer: PRIVATE HEALTH INSURANCE | Attending: Psychiatry | Admitting: Physical Therapy

## 2013-09-05 DIAGNOSIS — M25619 Stiffness of unspecified shoulder, not elsewhere classified: Secondary | ICD-10-CM | POA: Insufficient documentation

## 2013-09-05 DIAGNOSIS — IMO0001 Reserved for inherently not codable concepts without codable children: Secondary | ICD-10-CM | POA: Insufficient documentation

## 2013-09-05 DIAGNOSIS — R609 Edema, unspecified: Secondary | ICD-10-CM | POA: Insufficient documentation

## 2013-09-05 DIAGNOSIS — R293 Abnormal posture: Secondary | ICD-10-CM | POA: Insufficient documentation

## 2013-09-05 DIAGNOSIS — M2550 Pain in unspecified joint: Secondary | ICD-10-CM | POA: Insufficient documentation

## 2013-09-06 ENCOUNTER — Ambulatory Visit: Payer: PRIVATE HEALTH INSURANCE | Admitting: Physical Therapy

## 2013-09-08 ENCOUNTER — Ambulatory Visit: Payer: Worker's Compensation | Admitting: Physician Assistant

## 2013-09-12 ENCOUNTER — Ambulatory Visit: Payer: PRIVATE HEALTH INSURANCE | Admitting: Physical Therapy

## 2013-09-12 ENCOUNTER — Telehealth: Payer: Self-pay | Admitting: Family

## 2013-09-12 NOTE — Telephone Encounter (Signed)
Left message for pt requesting that she return our call.  Just wanted to check that she wants to follow through with referral to [facility] neuro as Dr. Pearletha Forge told me that she had seen another neuro group earlier in September.

## 2013-09-13 NOTE — Telephone Encounter (Signed)
Notified pt and she confirmed that she wants to proceed with [facility] for a 2nd opinion.

## 2013-09-14 ENCOUNTER — Ambulatory Visit: Payer: PRIVATE HEALTH INSURANCE | Admitting: Physical Therapy

## 2013-09-18 ENCOUNTER — Ambulatory Visit: Payer: PRIVATE HEALTH INSURANCE | Admitting: Physical Therapy

## 2013-09-21 ENCOUNTER — Ambulatory Visit: Payer: Worker's Compensation | Admitting: Neurology

## 2013-09-21 ENCOUNTER — Ambulatory Visit: Payer: PRIVATE HEALTH INSURANCE | Admitting: Physical Therapy

## 2013-10-11 ENCOUNTER — Other Ambulatory Visit (HOSPITAL_COMMUNITY): Payer: Self-pay | Admitting: Orthopedic Surgery

## 2013-10-11 DIAGNOSIS — M545 Low back pain: Secondary | ICD-10-CM

## 2013-10-17 ENCOUNTER — Ambulatory Visit (HOSPITAL_COMMUNITY)
Admission: RE | Admit: 2013-10-17 | Discharge: 2013-10-17 | Disposition: A | Discharge status: Home / Self Care | Payer: PRIVATE HEALTH INSURANCE | Source: Ambulatory Visit | Attending: Orthopedic Surgery | Admitting: Orthopedic Surgery

## 2013-10-17 DIAGNOSIS — M545 Low back pain: Secondary | ICD-10-CM

## 2013-11-03 IMAGING — US US EXTREM LOW VENOUS*R*
1 series · 14 of 18 positions shown · non-contrast
Comparison: None

CLINICAL DATA: Right calf pain and foot swelling, warmth, swelling
up to the thigh, question deep venous thrombosis

RIGHT LOWER EXTREMITY VENOUS DUPLEX ULTRASOUND
TECHNIQUE: Gray-scale sonography with graded compression, as well
as color Doppler and duplex ultrasound, were performed to evaluate
the deep venous system of the lower extremity from the level of the
common femoral vein through the popliteal and proximal calf veins.
Spectral Doppler was utilized to evaluate flow at rest and with
distal augmentation maneuvers.

[Series 1: us extrem low venous*right* · 14 of 18 slices shown]
[im 1/18]
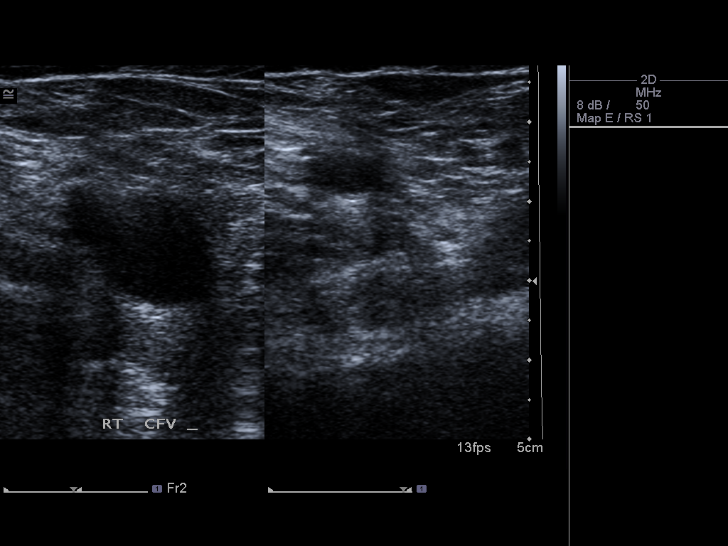
[im 2/18]
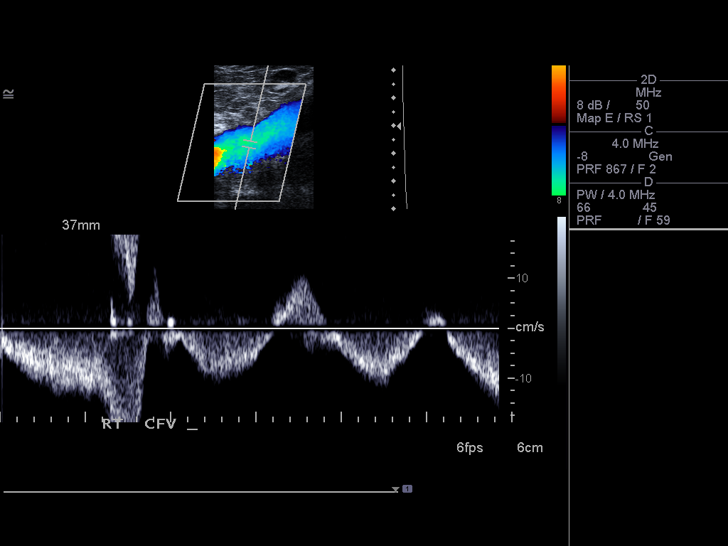
[im 4/18]
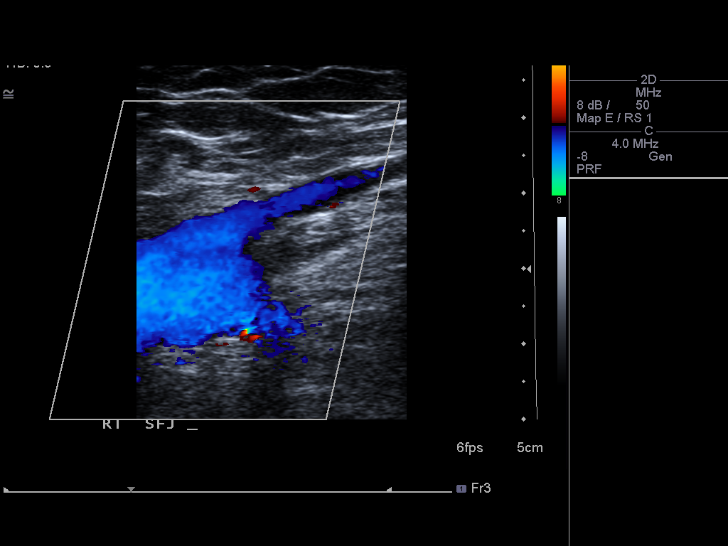
[im 5/18]
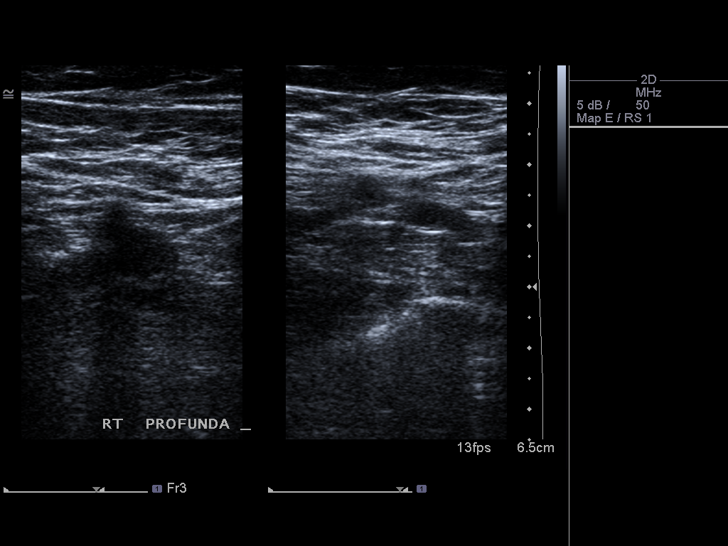
[im 6/18]
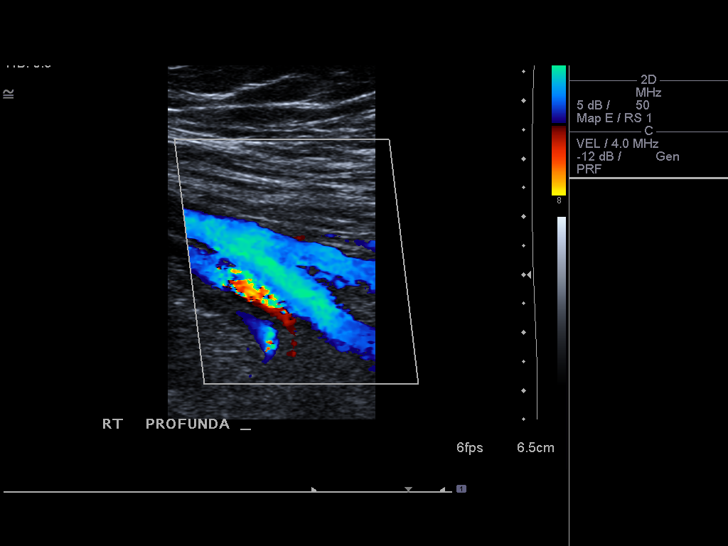
[im 8/18]
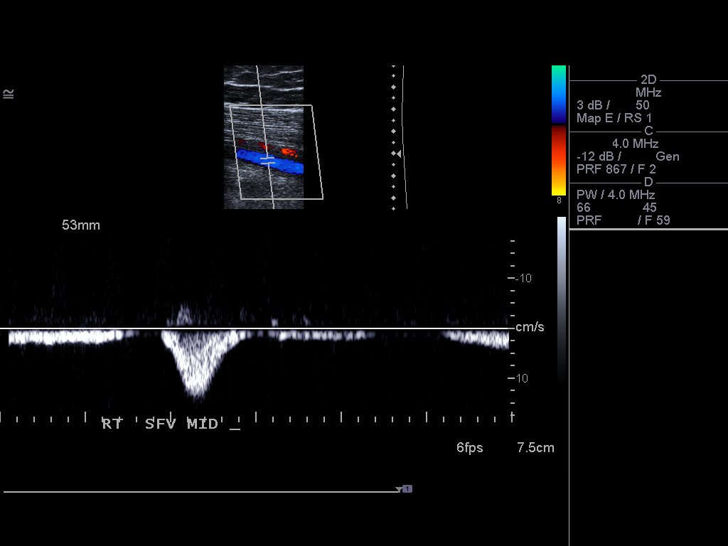
[im 9/18]
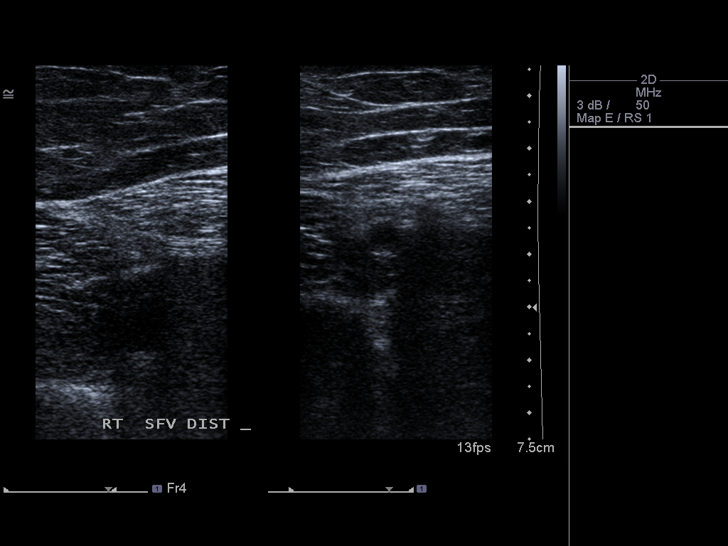
[im 10/18]
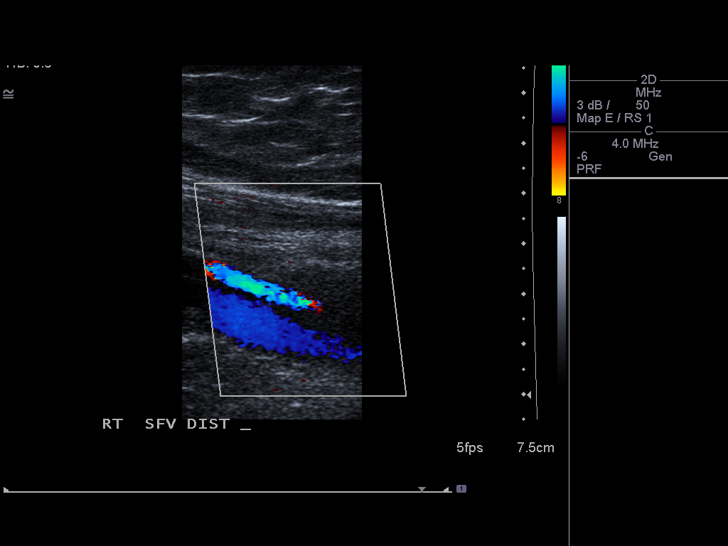
[im 11/18]
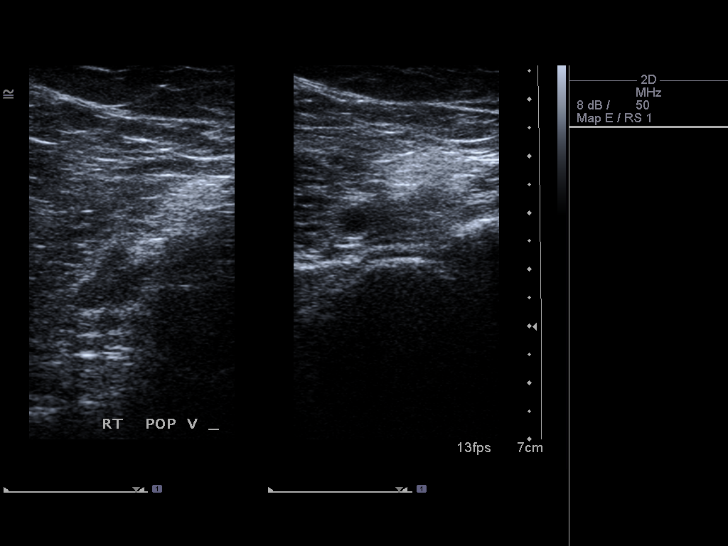
[im 13/18]
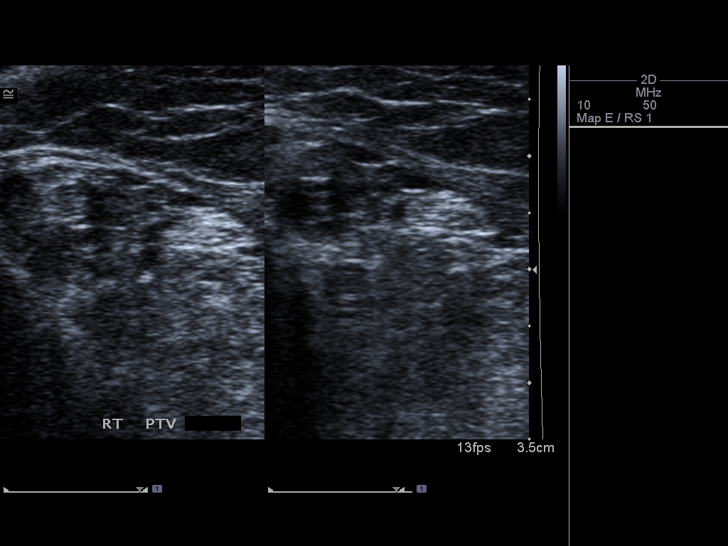
[im 14/18]
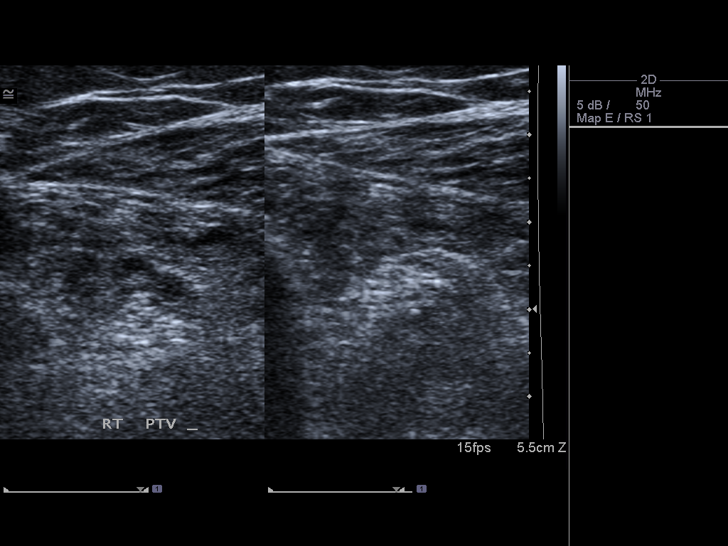
[im 15/18]
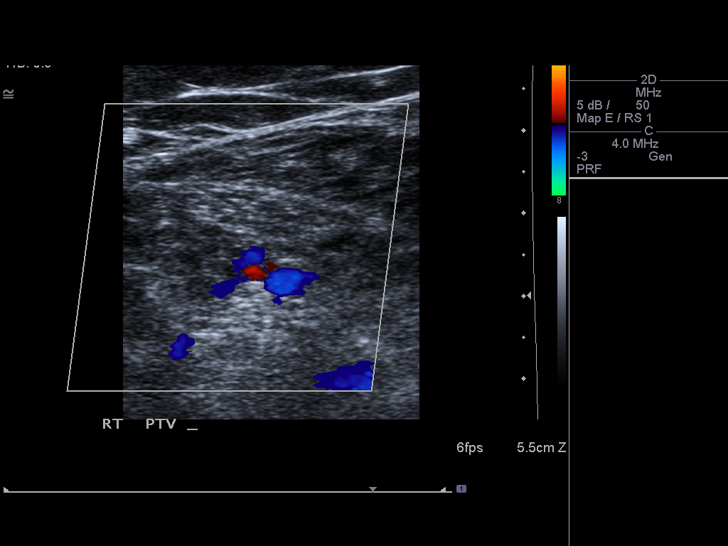
[im 17/18]
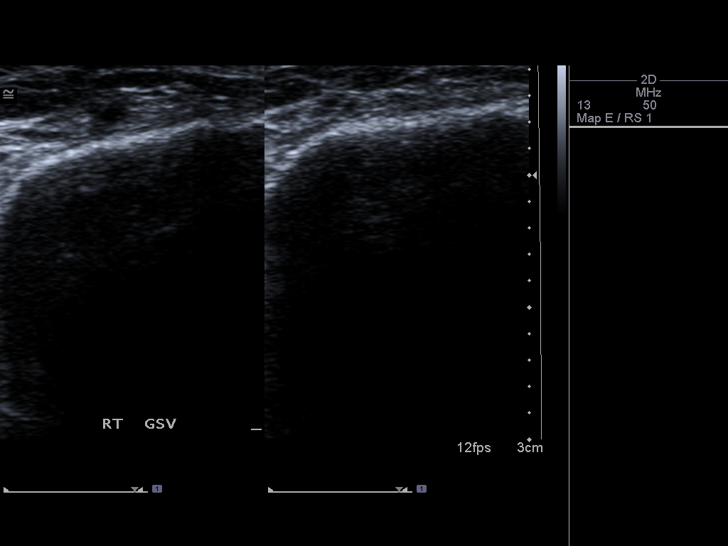
[im 18/18]
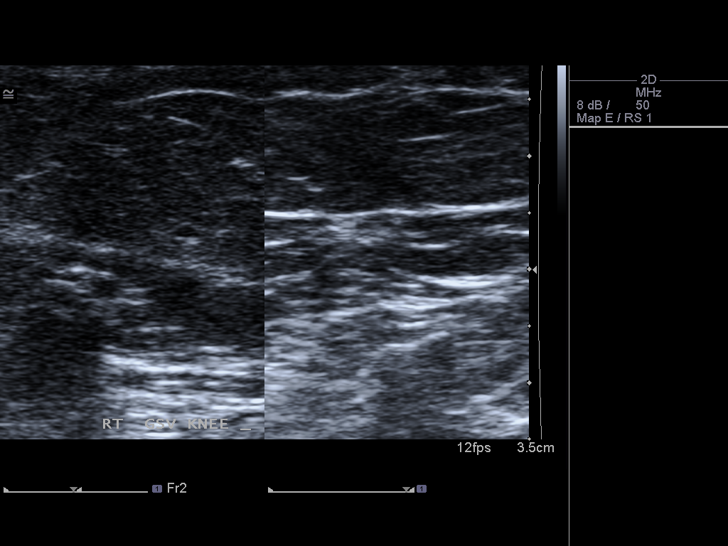

[14 of 18 positions shown; findings below may reference images not displayed]

FINDINGS: Deep venous system patent and compressible from right groin through
popliteal fossa.
Spontaneous venous flow present with intact augmentation and
evidence of respiratory phasicity.
No intraluminal thrombus identified.
Visualized portion of the greater saphenous system unremarkable.
IMPRESSION: No evidence of deep venous thrombosis in the right lower extremity.

## 2013-11-16 ENCOUNTER — Other Ambulatory Visit: Payer: Self-pay | Admitting: Orthopedic Surgery

## 2013-11-16 DIAGNOSIS — M546 Pain in thoracic spine: Secondary | ICD-10-CM

## 2013-11-26 ENCOUNTER — Ambulatory Visit
Admission: RE | Admit: 2013-11-26 | Discharge: 2013-11-26 | Disposition: A | Discharge status: Home / Self Care | Payer: Worker's Compensation | Source: Ambulatory Visit | Attending: Orthopedic Surgery | Admitting: Orthopedic Surgery

## 2013-11-26 DIAGNOSIS — M546 Pain in thoracic spine: Secondary | ICD-10-CM

## 2013-12-04 ENCOUNTER — Ambulatory Visit: Payer: Worker's Compensation | Admitting: Family

## 2013-12-04 DIAGNOSIS — Z0289 Encounter for other administrative examinations: Secondary | ICD-10-CM

## 2013-12-11 ENCOUNTER — Encounter: Payer: Self-pay | Admitting: Family

## 2013-12-11 ENCOUNTER — Ambulatory Visit (INDEPENDENT_AMBULATORY_CARE_PROVIDER_SITE_OTHER): Payer: 59 | Admitting: Family

## 2013-12-11 VITALS — BP 110/84 | HR 83 | Temp 98.4°F | Resp 16 | Ht 64.0 in | Wt 223.0 lb

## 2013-12-11 DIAGNOSIS — R300 Dysuria: Secondary | ICD-10-CM

## 2013-12-11 DIAGNOSIS — N390 Urinary tract infection, site not specified: Secondary | ICD-10-CM

## 2013-12-11 DIAGNOSIS — R3129 Other microscopic hematuria: Secondary | ICD-10-CM

## 2013-12-11 DIAGNOSIS — F17200 Nicotine dependence, unspecified, uncomplicated: Secondary | ICD-10-CM

## 2013-12-11 DIAGNOSIS — Z720 Tobacco use: Secondary | ICD-10-CM

## 2013-12-11 DIAGNOSIS — N760 Acute vaginitis: Secondary | ICD-10-CM | POA: Insufficient documentation

## 2013-12-11 LAB — POCT URINALYSIS DIPSTICK
Bilirubin, UA: NEGATIVE
GLUCOSE UA: NEGATIVE
Ketones, UA: NEGATIVE
LEUKOCYTES UA: NEGATIVE
Nitrite, UA: NEGATIVE
PROTEIN UA: NEGATIVE
Spec Grav, UA: 1.015
UROBILINOGEN UA: 0.2
pH, UA: 6.5

## 2013-12-11 MED ORDER — SULFAMETHOXAZOLE-TMP DS 800-160 MG PO TABS: 1.0000 | ORAL_TABLET | Freq: Two times a day (BID) | ORAL | Status: DC

## 2013-12-11 NOTE — Assessment & Plan Note (Signed)
Minimal white vaginal discharge noted without odor. Wet prep preformed.

## 2013-12-11 NOTE — Progress Notes (Signed)
   Subjective:    Patient ID: Kayla Jenkins, female    DOB: 05/08/1981, 33 y.o.   MRN: [id number]  Dysuria  Associated symptoms include frequency, hematuria and urgency. Pertinent negatives include no nausea or vomiting.  Vaginal Discharge The patient's primary symptoms include a vaginal discharge. Associated symptoms include abdominal pain, dysuria, frequency, hematuria and urgency. Pertinent negatives include no diarrhea, fever, nausea or vomiting.   Kayla Jenkins is a 33 year old female who presents today with dysuria and vaginal discharge. Patient started taking Toradol regularly two weeks ago and reports dysuria began then. Patient reports hematuria, urgency, and frequency.  Patient reporting whitish vaginal discharge x 2 weeks. Reports uses latex condoms for protection.     Review of Systems  Constitutional: Negative for fever.  HENT: Negative for rhinorrhea.   Respiratory: Negative for cough and shortness of breath.   Gastrointestinal: Positive for abdominal pain. Negative for nausea, vomiting and diarrhea.       Reports abdominal cramping.  Genitourinary: Positive for dysuria, urgency, frequency, hematuria and vaginal discharge. Negative for menstrual problem.       Objective:   Physical Exam  Constitutional: She is oriented to person, place, and time. She appears well-developed and well-nourished.  HENT:  Head: Normocephalic.  Cardiovascular: Normal rate and regular rhythm.   Pulmonary/Chest: Effort normal and breath sounds normal.  Genitourinary: Uterus normal. There is no rash or lesion on the right labia. There is no rash or lesion on the left labia. No bleeding around the vagina. Vaginal discharge found.  Minimal white vaginal discharge noted.  Neurological: She is alert and oriented to person, place, and time.  Skin: Skin is warm and dry.  No cervical motion tenderness upon palpation. Uterine size normal, no adnexal fullness.        Assessment & Plan:

## 2013-12-11 NOTE — Progress Notes (Signed)
Pre visit review using our clinic review tool, if applicable. No additional management support is needed unless otherwise documented below in the visit note. 

## 2013-12-11 NOTE — Assessment & Plan Note (Signed)
UA notes + blood. Will send for culture and Rx with bactrim.

## 2013-12-11 NOTE — Assessment & Plan Note (Signed)
Counseled patient on options for tobacco abuse include Chantix. Patient will let us know when she's ready to quit.

## 2013-12-11 NOTE — Patient Instructions (Signed)
Start bactrim for urinary tract infection. We will contact you with your results from your vaginal swab.

## 2013-12-12 LAB — URINE CULTURE
COLONY COUNT: NO GROWTH
Organism ID, Bacteria: NO GROWTH

## 2013-12-14 ENCOUNTER — Telehealth: Payer: Self-pay | Admitting: Family

## 2013-12-14 LAB — WET PREP BY MOLECULAR PROBE
Candida species: NEGATIVE
Gardnerella vaginalis: POSITIVE — AB
Trichomonas vaginosis: POSITIVE — AB

## 2013-12-14 MED ORDER — METRONIDAZOLE 500 MG PO TABS: 500.0000 mg | ORAL_TABLET | Freq: Two times a day (BID) | ORAL | Status: DC

## 2013-12-14 NOTE — Telephone Encounter (Signed)
Please call pt and let her know that wet prep shows + trichomonas and bacterial vaginosis. Rx sent for flagyl. Avoid alcohol while on this medication.  Partner needs treatment due to trichomonas. Avoid intercourse until at least 1 week after she and her partner have completed treatment.

## 2013-12-15 NOTE — Telephone Encounter (Signed)
Notified pt and she voices understanding. 

## 2013-12-29 ENCOUNTER — Telehealth: Payer: Self-pay | Admitting: Family

## 2013-12-29 NOTE — Telephone Encounter (Signed)
Relevant patient education mailed to patient.  

## 2014-02-13 ENCOUNTER — Ambulatory Visit: Payer: PRIVATE HEALTH INSURANCE | Attending: Orthopedic Surgery | Admitting: Physical Therapy

## 2014-02-13 DIAGNOSIS — M2550 Pain in unspecified joint: Secondary | ICD-10-CM | POA: Insufficient documentation

## 2014-02-13 DIAGNOSIS — R293 Abnormal posture: Secondary | ICD-10-CM | POA: Insufficient documentation

## 2014-02-13 DIAGNOSIS — M546 Pain in thoracic spine: Secondary | ICD-10-CM | POA: Insufficient documentation

## 2014-02-13 DIAGNOSIS — R5381 Other malaise: Secondary | ICD-10-CM | POA: Insufficient documentation

## 2014-02-13 DIAGNOSIS — IMO0001 Reserved for inherently not codable concepts without codable children: Secondary | ICD-10-CM | POA: Insufficient documentation

## 2014-02-14 ENCOUNTER — Ambulatory Visit (INDEPENDENT_AMBULATORY_CARE_PROVIDER_SITE_OTHER): Payer: Self-pay | Admitting: Physician Assistant

## 2014-02-14 ENCOUNTER — Other Ambulatory Visit (HOSPITAL_COMMUNITY)
Admission: RE | Admit: 2014-02-14 | Discharge: 2014-02-14 | Disposition: A | Discharge status: Home / Self Care | Payer: Self-pay | Source: Ambulatory Visit | Attending: Physician Assistant | Admitting: Physician Assistant

## 2014-02-14 ENCOUNTER — Encounter: Payer: Self-pay | Admitting: Physician Assistant

## 2014-02-14 VITALS — BP 116/82 | HR 83 | Temp 98.5°F | Resp 16 | Ht 64.0 in | Wt 222.8 lb

## 2014-02-14 DIAGNOSIS — R109 Unspecified abdominal pain: Secondary | ICD-10-CM

## 2014-02-14 DIAGNOSIS — R3989 Other symptoms and signs involving the genitourinary system: Secondary | ICD-10-CM

## 2014-02-14 DIAGNOSIS — N760 Acute vaginitis: Secondary | ICD-10-CM

## 2014-02-14 DIAGNOSIS — R1030 Lower abdominal pain, unspecified: Secondary | ICD-10-CM

## 2014-02-14 DIAGNOSIS — R399 Unspecified symptoms and signs involving the genitourinary system: Secondary | ICD-10-CM

## 2014-02-14 LAB — POCT URINALYSIS DIPSTICK
BILIRUBIN UA: NEGATIVE
GLUCOSE UA: NEGATIVE
KETONES UA: NEGATIVE
Leukocytes, UA: NEGATIVE
Nitrite, UA: NEGATIVE
SPEC GRAV UA: 1.01
Urobilinogen, UA: 0.2
pH, UA: 6

## 2014-02-14 MED ORDER — CIPROFLOXACIN HCL 250 MG PO TABS: 250.0000 mg | ORAL_TABLET | Freq: Two times a day (BID) | ORAL | Status: DC

## 2014-02-14 NOTE — Progress Notes (Signed)
Pre visit review using our clinic review tool, if applicable. No additional management support is needed unless otherwise documented below in the visit note/SLS  

## 2014-02-14 NOTE — Progress Notes (Signed)
Patient presents to clinic today c/o lower abdominal pain x 5 days.  Patient endorses dysuria, urinary urgency and frequency.  Patient denies nausea, vomiting, hematuria or flank pain.  LMP 2 weeks ago. Patient also endorses vaginal discharge that is clear and thin.  Has noticed on her underwear.  Patient denies recent sexual intercouse.  Has history of trichomoniasis in 11/2013 that she caught from her ex-boyfriend.  Was treated with course of Flagyl and noted complete resolution of symptoms.  Past Medical History  Diagnosis Date  . Migraine   . Multiple thyroid nodules 2009    Current Outpatient Prescriptions on File Prior to Visit  Medication Sig Dispense Refill  . diazepam (VALIUM) 5 MG tablet Take 1 tablet (5 mg total) by mouth every 8 (eight) hours as needed for anxiety.  60 tablet  0  . lidocaine (LIDODERM) 5 % Place 1 patch onto the skin daily. Remove & Discard patch within 12 hours or as directed by MD  15 patch  0   No current facility-administered medications on file prior to visit.    No Known Allergies  Family History  Problem Relation Age of Onset  . Hypertension Mother   . Heart disease Mother     CHF  . Hyperlipidemia Mother   . Seizures Father   . Hyperlipidemia Father   . Cancer Neg Hx   . Kidney disease Neg Hx   . Diabetes Neg Hx     History   Social History  . Marital Status: Married    Spouse Name: N/A    Number of Children: N/A  . Years of Education: N/A   Social History Main Topics  . Smoking status: Current Every Day Smoker    Types: Cigarettes    Last Attempt to Quit: 03/17/2013  . Smokeless tobacco: Never Used     Comment: 3 cigarettes per day  . Alcohol Use: No  . Drug Use: No  . Sexual Activity: None   Other Topics Concern  . None   Social History Narrative   2 children   Single   Works in Training and development officer at [employer]   Rare ETOH   Denies drug use   Has good support team in the area of family/friends   Enjoys travelling.           Review of Systems - See HPI.  All other ROS are negative.  BP 116/82  Pulse 83  Temp(Src) 98.5 F (36.9 C) (Oral)  Resp 16  Ht 5\' 4"  (1.626 m)  Wt 222 lb 12 oz (101.039 kg)  BMI 38.22 kg/m2  SpO2 98%  LMP 01/28/2014  Physical Exam  Vitals reviewed. Constitutional: She is oriented to person, place, and time and well-developed, well-nourished, and in no distress.  HENT:  Head: Normocephalic and atraumatic.  Eyes: Conjunctivae are normal.  Pulmonary/Chest: Effort normal.  Abdominal: Soft. Bowel sounds are normal.  Genitourinary: Uterus normal, cervix normal, right adnexa normal, left adnexa normal and vulva normal. Vagina exhibits no lesion. Thin  odorless  white and vaginal discharge found.  Neurological: She is alert and oriented to person, place, and time.  Skin: Skin is warm and dry. No rash noted.  Psychiatric: Affect normal.    Recent Results (from the past 2160 hour(s))  POCT URINALYSIS DIPSTICK     Status: None   Collection Time    12/11/13  4:06 PM      Result Value Ref Range   Color, UA yellow     Comment:  Urine cultured   Clarity, UA clear     Glucose, UA negative     Bilirubin, UA negative     Ketones, UA negative     Spec Grav, UA 1.015     Blood, UA moderate     pH, UA 6.5     Protein, UA negative     Urobilinogen, UA 0.2     Nitrite, UA negative     Leukocytes, UA Negative    URINE CULTURE     Status: None   Collection Time    12/11/13  4:08 PM      Result Value Ref Range   Colony Count NO GROWTH     Organism ID, Bacteria NO GROWTH    WET PREP BY MOLECULAR PROBE     Status: Abnormal   Collection Time    12/11/13  5:53 PM      Result Value Ref Range   Candida species NEG  Negative   Trichomonas vaginosis POS (*) Negative   Gardnerella vaginalis POS (*) Negative  POCT URINALYSIS DIPSTICK     Status: None   Collection Time    02/14/14  5:48 PM      Result Value Ref Range   Color, UA gold     Clarity, UA cloudy     Glucose, UA neg      Bilirubin, UA neg     Ketones, UA neg     Spec Grav, UA 1.010     Blood, UA moderate     pH, UA 6.0     Protein, UA trace     Urobilinogen, UA 0.2     Nitrite, UA neg     Leukocytes, UA Negative     Assessment/Plan: Vaginitis and vulvovaginitis Thin, white non-odorous discharge noted in vaginal vault. Wet prep sent.  UTI symptoms Has symptoms of UTI.  Urine dip shows moderate blood.  Will send for culture.  Empirically treat with Ciprofloxacin.     \

## 2014-02-14 NOTE — Assessment & Plan Note (Addendum)
Thin, white non-odorous discharge noted in vaginal vault. Wet prep sent.

## 2014-02-14 NOTE — Assessment & Plan Note (Signed)
Has symptoms of UTI.  Urine dip shows moderate blood.  Will send for culture.  Empirically treat with Ciprofloxacin.

## 2014-02-14 NOTE — Patient Instructions (Signed)
Please start taking antibiotic as directed.  Increase fluid intake.  Take a cranberry supplement.  Take a probiotic daily.  I will call you with your results.  We will begin an additional medication if indicated.

## 2014-02-15 ENCOUNTER — Ambulatory Visit: Payer: PRIVATE HEALTH INSURANCE | Admitting: Physical Therapy

## 2014-02-16 ENCOUNTER — Telehealth: Payer: Self-pay | Admitting: Family

## 2014-02-16 LAB — CERVICOVAGINAL ANCILLARY ONLY
WET PREP (BD AFFIRM): NEGATIVE
WET PREP (BD AFFIRM): NEGATIVE
WET PREP (BD AFFIRM): POSITIVE — AB

## 2014-02-16 LAB — CULTURE, URINE COMPREHENSIVE
Colony Count: NO GROWTH
ORGANISM ID, BACTERIA: NO GROWTH

## 2014-02-16 MED ORDER — METRONIDAZOLE 500 MG PO TABS: 500.0000 mg | ORAL_TABLET | Freq: Two times a day (BID) | ORAL | Status: DC

## 2014-02-16 NOTE — Telephone Encounter (Signed)
Notified pt and she voices understanding. 

## 2014-02-16 NOTE — Telephone Encounter (Signed)
Please call pt and let her know that I reviewed the wet prep that cody sent.  Notes + bacterial vaginosis.. Rx sent for metronidazole. No ETOH while on this med.

## 2014-02-20 ENCOUNTER — Ambulatory Visit: Payer: PRIVATE HEALTH INSURANCE | Admitting: Physical Therapy

## 2014-02-22 ENCOUNTER — Ambulatory Visit: Payer: PRIVATE HEALTH INSURANCE | Admitting: Physical Therapy

## 2014-02-27 ENCOUNTER — Ambulatory Visit: Payer: PRIVATE HEALTH INSURANCE | Admitting: Physical Therapy

## 2014-03-01 ENCOUNTER — Ambulatory Visit: Payer: PRIVATE HEALTH INSURANCE | Attending: Orthopedic Surgery | Admitting: Physical Therapy

## 2014-03-01 DIAGNOSIS — R5381 Other malaise: Secondary | ICD-10-CM | POA: Insufficient documentation

## 2014-03-01 DIAGNOSIS — R293 Abnormal posture: Secondary | ICD-10-CM | POA: Insufficient documentation

## 2014-03-01 DIAGNOSIS — M2550 Pain in unspecified joint: Secondary | ICD-10-CM | POA: Insufficient documentation

## 2014-03-01 DIAGNOSIS — M546 Pain in thoracic spine: Secondary | ICD-10-CM | POA: Insufficient documentation

## 2014-03-01 DIAGNOSIS — IMO0001 Reserved for inherently not codable concepts without codable children: Secondary | ICD-10-CM | POA: Insufficient documentation

## 2014-03-06 ENCOUNTER — Ambulatory Visit: Payer: PRIVATE HEALTH INSURANCE | Admitting: Physical Therapy

## 2014-03-08 ENCOUNTER — Encounter: Payer: Self-pay | Admitting: Physical Therapy

## 2014-05-29 ENCOUNTER — Ambulatory Visit: Payer: PRIVATE HEALTH INSURANCE | Attending: Orthopedic Surgery

## 2014-05-29 DIAGNOSIS — M2550 Pain in unspecified joint: Secondary | ICD-10-CM | POA: Insufficient documentation

## 2014-05-29 DIAGNOSIS — IMO0001 Reserved for inherently not codable concepts without codable children: Secondary | ICD-10-CM | POA: Insufficient documentation

## 2014-05-29 DIAGNOSIS — R293 Abnormal posture: Secondary | ICD-10-CM | POA: Insufficient documentation

## 2014-05-29 DIAGNOSIS — M2560 Stiffness of unspecified joint, not elsewhere classified: Secondary | ICD-10-CM | POA: Insufficient documentation

## 2014-06-06 ENCOUNTER — Ambulatory Visit: Payer: PRIVATE HEALTH INSURANCE | Attending: Orthopedic Surgery | Admitting: Physical Therapy

## 2014-06-06 DIAGNOSIS — M2550 Pain in unspecified joint: Secondary | ICD-10-CM | POA: Insufficient documentation

## 2014-06-06 DIAGNOSIS — IMO0001 Reserved for inherently not codable concepts without codable children: Secondary | ICD-10-CM | POA: Insufficient documentation

## 2014-06-06 DIAGNOSIS — M546 Pain in thoracic spine: Secondary | ICD-10-CM | POA: Insufficient documentation

## 2014-06-06 DIAGNOSIS — R5381 Other malaise: Secondary | ICD-10-CM | POA: Insufficient documentation

## 2014-06-06 DIAGNOSIS — R293 Abnormal posture: Secondary | ICD-10-CM | POA: Insufficient documentation

## 2014-06-12 ENCOUNTER — Ambulatory Visit: Payer: PRIVATE HEALTH INSURANCE | Admitting: Physical Therapy

## 2014-06-14 ENCOUNTER — Ambulatory Visit: Payer: PRIVATE HEALTH INSURANCE | Admitting: Rehabilitation

## 2014-06-19 ENCOUNTER — Ambulatory Visit: Payer: PRIVATE HEALTH INSURANCE | Admitting: Physical Therapy

## 2014-06-21 ENCOUNTER — Ambulatory Visit: Payer: PRIVATE HEALTH INSURANCE | Admitting: Rehabilitation

## 2014-07-31 ENCOUNTER — Ambulatory Visit: Payer: PRIVATE HEALTH INSURANCE | Attending: Orthopedic Surgery

## 2014-07-31 DIAGNOSIS — R5381 Other malaise: Secondary | ICD-10-CM | POA: Diagnosis not present

## 2014-07-31 DIAGNOSIS — M2550 Pain in unspecified joint: Secondary | ICD-10-CM | POA: Diagnosis not present

## 2014-07-31 DIAGNOSIS — IMO0001 Reserved for inherently not codable concepts without codable children: Secondary | ICD-10-CM | POA: Diagnosis present

## 2014-07-31 DIAGNOSIS — M546 Pain in thoracic spine: Secondary | ICD-10-CM | POA: Insufficient documentation

## 2014-07-31 DIAGNOSIS — R293 Abnormal posture: Secondary | ICD-10-CM | POA: Insufficient documentation

## 2014-09-25 ENCOUNTER — Other Ambulatory Visit: Payer: Self-pay | Admitting: Occupational Medicine

## 2014-09-25 ENCOUNTER — Ambulatory Visit: Payer: Self-pay

## 2014-09-25 DIAGNOSIS — M25562 Pain in left knee: Secondary | ICD-10-CM

## 2016-10-16 ENCOUNTER — Other Ambulatory Visit: Payer: Self-pay | Admitting: Orthopedic Surgery

## 2016-10-16 DIAGNOSIS — M25571 Pain in right ankle and joints of right foot: Secondary | ICD-10-CM

## 2016-10-21 ENCOUNTER — Ambulatory Visit
Admission: RE | Admit: 2016-10-21 | Discharge: 2016-10-21 | Disposition: A | Discharge status: Home / Self Care | Payer: BC Managed Care – PPO | Source: Ambulatory Visit | Attending: Orthopedic Surgery | Admitting: Orthopedic Surgery

## 2016-10-21 DIAGNOSIS — M25571 Pain in right ankle and joints of right foot: Secondary | ICD-10-CM

## 2016-10-26 ENCOUNTER — Other Ambulatory Visit: Payer: Self-pay | Admitting: *Deleted

## 2016-10-26 DIAGNOSIS — G589 Mononeuropathy, unspecified: Secondary | ICD-10-CM

## 2016-10-27 ENCOUNTER — Ambulatory Visit (INDEPENDENT_AMBULATORY_CARE_PROVIDER_SITE_OTHER): Payer: BC Managed Care – PPO | Admitting: Neurology

## 2016-10-27 DIAGNOSIS — M79604 Pain in right leg: Secondary | ICD-10-CM

## 2016-10-27 DIAGNOSIS — G589 Mononeuropathy, unspecified: Secondary | ICD-10-CM | POA: Diagnosis not present
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
## 2016-11-20 ENCOUNTER — Emergency Department (HOSPITAL_BASED_OUTPATIENT_CLINIC_OR_DEPARTMENT_OTHER): Payer: BC Managed Care – PPO

## 2016-11-20 ENCOUNTER — Emergency Department (HOSPITAL_BASED_OUTPATIENT_CLINIC_OR_DEPARTMENT_OTHER)
Admission: EM | Admit: 2016-11-20 | Discharge: 2016-11-20 | Disposition: A | Discharge status: Home / Self Care | Payer: BC Managed Care – PPO | Attending: Emergency Medicine | Admitting: Emergency Medicine

## 2016-11-20 ENCOUNTER — Encounter (HOSPITAL_BASED_OUTPATIENT_CLINIC_OR_DEPARTMENT_OTHER): Payer: Self-pay

## 2016-11-20 DIAGNOSIS — F17210 Nicotine dependence, cigarettes, uncomplicated: Secondary | ICD-10-CM | POA: Insufficient documentation

## 2016-11-20 DIAGNOSIS — M545 Low back pain: Secondary | ICD-10-CM | POA: Diagnosis not present

## 2016-11-20 DIAGNOSIS — M25571 Pain in right ankle and joints of right foot: Secondary | ICD-10-CM | POA: Diagnosis present

## 2016-11-20 HISTORY — DX: Dorsalgia, unspecified: M54.9

## 2016-11-20 HISTORY — DX: Sciatica, unspecified side: M54.30

## 2016-11-20 MED ORDER — HYDROCODONE-ACETAMINOPHEN 5-325 MG PO TABS: 2.0000 | ORAL_TABLET | ORAL | 0 refills | Status: DC | PRN

## 2016-11-20 MED ORDER — DICLOFENAC SODIUM 75 MG PO TBEC: 75.0000 mg | DELAYED_RELEASE_TABLET | Freq: Two times a day (BID) | ORAL | 0 refills | Status: DC

## 2016-11-20 NOTE — Discharge Instructions (Signed)
See your Primary care provider for evaluation this week .
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
## 2016-11-20 NOTE — ED Notes (Signed)
ED Provider at bedside. 

## 2017-05-20 ENCOUNTER — Other Ambulatory Visit: Payer: Self-pay | Admitting: Anesthesiology

## 2017-05-28 ENCOUNTER — Encounter: Payer: Self-pay | Admitting: Psychology

## 2017-06-25 ENCOUNTER — Encounter: Payer: BC Managed Care – PPO | Attending: Psychology | Admitting: Psychology

## 2017-06-25 ENCOUNTER — Encounter: Payer: Self-pay | Admitting: Psychology

## 2017-06-25 DIAGNOSIS — G894 Chronic pain syndrome: Secondary | ICD-10-CM

## 2017-06-25 DIAGNOSIS — F322 Major depressive disorder, single episode, severe without psychotic features: Secondary | ICD-10-CM | POA: Diagnosis not present

## 2017-06-25 DIAGNOSIS — F4310 Post-traumatic stress disorder, unspecified: Secondary | ICD-10-CM | POA: Diagnosis not present
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
## 2017-07-01 ENCOUNTER — Encounter: Payer: BC Managed Care – PPO | Attending: Psychology | Admitting: Psychology

## 2017-07-01 ENCOUNTER — Encounter: Payer: Self-pay | Admitting: Psychology

## 2017-07-01 DIAGNOSIS — F4310 Post-traumatic stress disorder, unspecified: Secondary | ICD-10-CM | POA: Insufficient documentation

## 2017-07-01 DIAGNOSIS — G894 Chronic pain syndrome: Secondary | ICD-10-CM | POA: Diagnosis not present

## 2017-07-01 DIAGNOSIS — F322 Major depressive disorder, single episode, severe without psychotic features: Secondary | ICD-10-CM | POA: Diagnosis not present
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
## 2017-07-03 ENCOUNTER — Emergency Department (HOSPITAL_BASED_OUTPATIENT_CLINIC_OR_DEPARTMENT_OTHER)
Admission: EM | Admit: 2017-07-03 | Discharge: 2017-07-03 | Disposition: A | Discharge status: Home / Self Care | Payer: BC Managed Care – PPO | Attending: Emergency Medicine | Admitting: Emergency Medicine

## 2017-07-03 ENCOUNTER — Encounter (HOSPITAL_BASED_OUTPATIENT_CLINIC_OR_DEPARTMENT_OTHER): Payer: Self-pay | Admitting: Emergency Medicine

## 2017-07-03 DIAGNOSIS — Z79899 Other long term (current) drug therapy: Secondary | ICD-10-CM | POA: Diagnosis not present

## 2017-07-03 DIAGNOSIS — R102 Pelvic and perineal pain: Secondary | ICD-10-CM | POA: Diagnosis present

## 2017-07-03 DIAGNOSIS — Z711 Person with feared health complaint in whom no diagnosis is made: Secondary | ICD-10-CM

## 2017-07-03 DIAGNOSIS — N898 Other specified noninflammatory disorders of vagina: Secondary | ICD-10-CM | POA: Diagnosis not present

## 2017-07-03 DIAGNOSIS — Z202 Contact with and (suspected) exposure to infections with a predominantly sexual mode of transmission: Secondary | ICD-10-CM | POA: Diagnosis not present

## 2017-07-03 DIAGNOSIS — Z7722 Contact with and (suspected) exposure to environmental tobacco smoke (acute) (chronic): Secondary | ICD-10-CM | POA: Diagnosis not present

## 2017-07-03 LAB — WET PREP, GENITAL
Sperm: NONE SEEN
TRICH WET PREP: NONE SEEN
YEAST WET PREP: NONE SEEN

## 2017-07-03 LAB — URINALYSIS, ROUTINE W REFLEX MICROSCOPIC
BILIRUBIN URINE: NEGATIVE
Glucose, UA: NEGATIVE mg/dL
KETONES UR: 15 mg/dL — AB
Leukocytes, UA: NEGATIVE
NITRITE: NEGATIVE
PH: 6 (ref 5.0–8.0)
Protein, ur: NEGATIVE mg/dL
SPECIFIC GRAVITY, URINE: 1.021 (ref 1.005–1.030)

## 2017-07-03 LAB — URINALYSIS, MICROSCOPIC (REFLEX)

## 2017-07-03 LAB — PREGNANCY, URINE: Preg Test, Ur: NEGATIVE

## 2017-07-03 MED ORDER — CEFTRIAXONE SODIUM 250 MG IJ SOLR
250.0000 mg | Freq: Once | INTRAMUSCULAR | Status: AC
  Administered 2017-07-03: 250 mg via INTRAMUSCULAR
  Filled 2017-07-03: qty 250

## 2017-07-03 MED ORDER — AZITHROMYCIN 250 MG PO TABS
1000.0000 mg | ORAL_TABLET | Freq: Once | ORAL | Status: AC
  Administered 2017-07-03: 1000 mg via ORAL
  Filled 2017-07-03: qty 4

## 2017-07-03 MED ORDER — KETOROLAC TROMETHAMINE 30 MG/ML IJ SOLN
30.0000 mg | Freq: Once | INTRAMUSCULAR | Status: AC
  Administered 2017-07-03: 30 mg via INTRAMUSCULAR
  Filled 2017-07-03: qty 1

## 2017-07-03 MED ORDER — NAPROXEN 500 MG PO TABS: 500.0000 mg | ORAL_TABLET | Freq: Two times a day (BID) | ORAL | 0 refills | Status: DC

## 2017-07-03 NOTE — ED Triage Notes (Signed)
Patient reports that she woke up this am with lower pelvic pain into her groin

## 2017-07-03 NOTE — ED Provider Notes (Signed)
[facility] Provider Note   CSN: [id number] Arrival date & time: 07/03/17  2045  By signing my name below, I, Vista Minkobert Ross, attest that this documentation has been prepared under the direction and in the presence of Freedom Peddy, Mayer Maskerourtney F, MD. Electronically signed, Vista Minkobert Ross, ED Scribe. 07/03/17. 10:39 PM.  History   Chief Complaint Chief Complaint  Patient presents with  . Pelvic Pain    HPI HPI Comments: Kayla Jenkins is a 36 y.o. female who presents to the Emergency Department complaining of vaginal discharge with associated suprapubic abdominal pain that started this morning. Pt reports that she noted clear vaginal discharge this morning. Tonight she was changing underwear and noticed thick white vaginal discharge. She further reports dull 5/10 suprapubic abdominal pain that started today as well. Pt took 800mg  ibuprofen this afternoon with minimal relief. Pt further notes increased urinary frequency today. Pt denies any new sexual partners. She is currently sexually active with one partner but does not use protection during intercourse. She denies any chance that she could be pregnant due to tubal ligation. No fever, dysuria, hematuria. No nausea, vomiting, diarrhea.   The history is provided by the patient. No language interpreter was used.    Past Medical History:  Diagnosis Date  . Back pain   . Migraine   . Multiple thyroid nodules 2009  . Sciatica     Patient Active Problem List   Diagnosis Date Noted  . UTI symptoms 02/14/2014  . Vaginitis and vulvovaginitis 12/11/2013  . Infection of urinary tract 12/11/2013  . Right leg pain 07/24/2013  . Neck pain 06/26/2013  . Muscle strain, shoulder region 06/20/2013  . H/O thyroid nodule 03/26/2013  . Irregular menses 03/24/2013  . Allergic rhinitis 02/14/2013  . Depression with anxiety 02/14/2013  . Tobacco abuse 02/14/2013  . History of thyroid nodule 02/14/2013  . Migraines 02/14/2013  . Arthralgia  02/14/2013    Past Surgical History:  Procedure Laterality Date  . ANKLE SURGERY    . CESAREAN SECTION     2001 and 2006  . TUBAL LIGATION      OB History    No data available       Home Medications    Prior to Admission medications   Medication Sig Start Date End Date Taking? Authorizing Provider  diclofenac (VOLTAREN) 75 MG EC tablet Take 1 tablet (75 mg total) by mouth 2 (two) times daily. 11/20/16   Elson AreasSofia, Leslie K, PA-C  HYDROcodone-acetaminophen (NORCO/VICODIN) 5-325 MG tablet Take 2 tablets by mouth every 4 (four) hours as needed. 11/20/16   Elson AreasSofia, Leslie K, PA-C  naproxen (NAPROSYN) 500 MG tablet Take 1 tablet (500 mg total) by mouth 2 (two) times daily. 07/03/17   Maressa Apollo, Mayer Maskerourtney F, MD    Family History Family History  Problem Relation Age of Onset  . Hypertension Mother   . Heart disease Mother        CHF  . Hyperlipidemia Mother   . Seizures Father   . Hyperlipidemia Father   . Cancer Neg Hx   . Kidney disease Neg Hx   . Diabetes Neg Hx     Social History Social History  Substance Use Topics  . Smoking status: Current Every Day Smoker    Types: Cigarettes  . Smokeless tobacco: Never Used  . Alcohol use No     Allergies   Hydrocodone and Tramadol   Review of Systems Review of Systems  Constitutional: Negative for fever.  Respiratory: Negative for shortness  of breath.   Cardiovascular: Negative for chest pain.  Gastrointestinal: Positive for abdominal pain. Negative for diarrhea, nausea and vomiting.  Genitourinary: Positive for frequency and vaginal discharge. Negative for dysuria, hematuria, vaginal bleeding and vaginal pain.  All other systems reviewed and are negative.    Physical Exam Updated Vital Signs BP 136/80 (BP Location: Right Arm)   Pulse 90   Temp 98.3 F (36.8 C) (Oral)   Resp 20   Ht 5\' 2"  (1.575 m)   Wt 84.8 kg (187 lb)   LMP 06/17/2017   SpO2 100%   BMI 34.20 kg/m   Physical Exam  Constitutional: She is oriented  to person, place, and time. She appears well-developed and well-nourished. No distress.  HENT:  Head: Normocephalic and atraumatic.  Neck: Normal range of motion.  Cardiovascular: Normal rate and regular rhythm.   No murmur heard. Pulmonary/Chest: Effort normal and breath sounds normal. No respiratory distress.  Abdominal: Soft. She exhibits no distension. There is no tenderness. There is no rebound and no guarding.  No significant abdominal tenderness on exam  Genitourinary:  Genitourinary Comments: Normal external vaginal exam, scant vaginal discharge, no cervical motion or adnexal tenderness  Neurological: She is alert and oriented to person, place, and time.  Skin: Skin is warm and dry. She is not diaphoretic.  Psychiatric: She has a normal mood and affect. Judgment normal.  Nursing note and vitals reviewed.    ED Treatments / Results  DIAGNOSTIC STUDIES: Oxygen Saturation is 100% on RA, normal by my interpretation.  COORDINATION OF CARE: 9:56 PM-Discussed treatment plan with pt at bedside and pt agreed to plan.   Labs (all labs ordered are listed, but only abnormal results are displayed) Labs Reviewed  WET PREP, GENITAL - Abnormal; Notable for the following:       Result Value   Clue Cells Wet Prep HPF POC PRESENT (*)    WBC, Wet Prep HPF POC FEW (*)    All other components within normal limits  URINALYSIS, ROUTINE W REFLEX MICROSCOPIC - Abnormal; Notable for the following:    APPearance CLOUDY (*)    Hgb urine dipstick SMALL (*)    Ketones, ur 15 (*)    All other components within normal limits  URINALYSIS, MICROSCOPIC (REFLEX) - Abnormal; Notable for the following:    Bacteria, UA RARE (*)    Squamous Epithelial / LPF 6-30 (*)    All other components within normal limits  URINE CULTURE  PREGNANCY, URINE  GC/CHLAMYDIA PROBE AMP ([place]) NOT AT [facility] [place]    EKG  EKG Interpretation None       Radiology No results found.  Procedures Procedures (including  critical care time)  Medications Ordered in ED Medications  ketorolac (TORADOL) 30 MG/ML injection 30 mg (30 mg Intramuscular Given 07/03/17 2208)  azithromycin (ZITHROMAX) tablet 1,000 mg (1,000 mg Oral Given 07/03/17 2207)  cefTRIAXone (ROCEPHIN) injection 250 mg (250 mg Intramuscular Given 07/03/17 2213)     Initial Impression / Assessment and Plan / ED Course  I have reviewed the triage vital signs and the nursing notes.  Pertinent labs & imaging results that were available during my care of the patient were reviewed by me and considered in my medical decision making (see chart for details).     Patient presents with lower abdominal discomfort, vaginal discharge, frequency. She is nontoxic on exam. Minimal tenderness on exam. No lateralizing tenderness. Patient was tested and empirically treated for STDs. Pelvic exam is reassuring. She is given  Toradol for pain with resolution of symptoms. Urinalysis without clear UTI. 0-5 white cells and rare bacteria. Urine culture sent given frequency. Do not feel patient needs further imaging at this time.  After history, exam, and medical workup I feel the patient has been appropriately medically screened and is safe for discharge home. Pertinent diagnoses were discussed with the patient. Patient was given return precautions.   Final Clinical Impressions(s) / ED Diagnoses   Final diagnoses:  Vaginal discharge  Concern about STD in female without diagnosis    New Prescriptions New Prescriptions   NAPROXEN (NAPROSYN) 500 MG TABLET    Take 1 tablet (500 mg total) by mouth 2 (two) times daily.   I personally performed the services described in this documentation, which was scribed in my presence. The recorded information has been reviewed and is accurate.     Shon BatonHorton, Sherl Yzaguirre F, MD 07/03/17 2241

## 2017-07-03 NOTE — ED Notes (Signed)
Pt verbalized that she does have some vaginal discharge, cottage-like discharge and white discharge but not odor. She frequently urinates in small and she feels like her bladder is full after urinating.

## 2017-07-03 NOTE — Discharge Instructions (Signed)
You were seen today for lower abdominal pain and concern for vaginal discharge. You were tested and treated for STDs as a precaution. Otherwise her workup is largely reassuring. See information regarding STDs above. Abstain from sexual activity for the next 10 days.

## 2017-07-05 LAB — URINE CULTURE: CULTURE: NO GROWTH

## 2017-07-05 LAB — GC/CHLAMYDIA PROBE AMP (~~LOC~~) NOT AT ARMC
CHLAMYDIA, DNA PROBE: NEGATIVE
Neisseria Gonorrhea: NEGATIVE

## 2017-07-20 ENCOUNTER — Encounter: Payer: Self-pay | Admitting: Psychology

## 2017-07-20 ENCOUNTER — Encounter (HOSPITAL_BASED_OUTPATIENT_CLINIC_OR_DEPARTMENT_OTHER): Payer: BC Managed Care – PPO | Admitting: Psychology

## 2017-07-20 DIAGNOSIS — F322 Major depressive disorder, single episode, severe without psychotic features: Secondary | ICD-10-CM | POA: Diagnosis not present

## 2017-07-20 DIAGNOSIS — G894 Chronic pain syndrome: Secondary | ICD-10-CM | POA: Diagnosis not present

## 2017-07-20 DIAGNOSIS — F4310 Post-traumatic stress disorder, unspecified: Secondary | ICD-10-CM | POA: Diagnosis not present
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
## 2017-08-26 ENCOUNTER — Encounter: Payer: BC Managed Care – PPO | Attending: Psychology | Admitting: Psychology

## 2017-08-26 DIAGNOSIS — G894 Chronic pain syndrome: Secondary | ICD-10-CM | POA: Insufficient documentation

## 2017-08-26 DIAGNOSIS — F322 Major depressive disorder, single episode, severe without psychotic features: Secondary | ICD-10-CM | POA: Insufficient documentation

## 2017-08-26 DIAGNOSIS — F4310 Post-traumatic stress disorder, unspecified: Secondary | ICD-10-CM | POA: Insufficient documentation

## 2017-08-27 ENCOUNTER — Emergency Department (HOSPITAL_BASED_OUTPATIENT_CLINIC_OR_DEPARTMENT_OTHER)
Admission: EM | Admit: 2017-08-27 | Discharge: 2017-08-27 | Disposition: A | Discharge status: Home / Self Care | Payer: Self-pay | Attending: Emergency Medicine | Admitting: Emergency Medicine

## 2017-08-27 ENCOUNTER — Encounter (HOSPITAL_BASED_OUTPATIENT_CLINIC_OR_DEPARTMENT_OTHER): Payer: Self-pay | Admitting: Emergency Medicine

## 2017-08-27 DIAGNOSIS — N764 Abscess of vulva: Secondary | ICD-10-CM | POA: Insufficient documentation

## 2017-08-27 DIAGNOSIS — Z79899 Other long term (current) drug therapy: Secondary | ICD-10-CM | POA: Insufficient documentation

## 2017-08-27 DIAGNOSIS — F17210 Nicotine dependence, cigarettes, uncomplicated: Secondary | ICD-10-CM | POA: Insufficient documentation

## 2017-08-27 DIAGNOSIS — L0291 Cutaneous abscess, unspecified: Secondary | ICD-10-CM

## 2017-08-27 HISTORY — DX: Post-traumatic stress disorder, unspecified: F43.10

## 2017-08-27 LAB — URINALYSIS, ROUTINE W REFLEX MICROSCOPIC
BILIRUBIN URINE: NEGATIVE
GLUCOSE, UA: NEGATIVE mg/dL
Ketones, ur: NEGATIVE mg/dL
Leukocytes, UA: NEGATIVE
Nitrite: NEGATIVE
PROTEIN: NEGATIVE mg/dL
pH: 6 (ref 5.0–8.0)

## 2017-08-27 LAB — URINALYSIS, MICROSCOPIC (REFLEX)

## 2017-08-27 LAB — PREGNANCY, URINE: PREG TEST UR: NEGATIVE

## 2017-08-27 MED ORDER — CLINDAMYCIN HCL 150 MG PO CAPS: 300.0000 mg | ORAL_CAPSULE | Freq: Three times a day (TID) | ORAL | 0 refills | Status: DC

## 2017-08-27 MED ORDER — OXYCODONE-ACETAMINOPHEN 5-325 MG PO TABS: 1.0000 | ORAL_TABLET | ORAL | 0 refills | Status: DC | PRN

## 2017-08-27 MED ORDER — CLINDAMYCIN HCL 150 MG PO CAPS
300.0000 mg | ORAL_CAPSULE | Freq: Once | ORAL | Status: AC
Start: 2017-08-27 — End: 2017-08-27
  Administered 2017-08-27: 300 mg via ORAL
  Filled 2017-08-27: qty 2

## 2017-08-27 MED FILL — OXYCODONE-ACETAMINOPHEN 5-3: 5-325 | 2 days supply | Qty: 10 | Fill #0

## 2017-08-27 MED FILL — CLINDAMYCIN HCL 150 MG CAPS: 150 | 10 days supply | Qty: 60 | Fill #0

## 2017-08-27 NOTE — ED Provider Notes (Signed)
[facility] Provider [facility]   CSN: [id number] Arrival date & time: 08/27/17  1203     History   Chief Complaint Chief Complaint  Patient presents with  . Abscess    HPI Kayla Jenkins is a 36 y.o. female.  The history is provided by the patient and medical records.  Abscess     36 y.o. F with hx of PTSD, migraine headaches, thyroid nodules, sciatica, presenting to the ED for abscess of left vaginal area.  Patient states she started feeling some irritation in her genital region about 3 days ago.  States she did shave recently so thought she just had a "hair bump".  States she has tried home care with warm soaks, sitz bath, tea tree oil, and cleansing with peroxide but area has gotten larger and more painful.  No fever, chills.  No redness of the legs.  No hx of same.  No urinary symptoms or vaginal discharge.  Past Medical History:  Diagnosis Date  . Back pain   . Migraine   . Multiple thyroid nodules 2009  . PTSD (post-traumatic stress disorder)   . Sciatica     Patient Active Problem List   Diagnosis Date Noted  . UTI symptoms 02/14/2014  . Vaginitis and vulvovaginitis 12/11/2013  . Infection of urinary tract 12/11/2013  . Right leg pain 07/24/2013  . Neck pain 06/26/2013  . Muscle strain, shoulder region 06/20/2013  . H/O thyroid nodule 03/26/2013  . Irregular menses 03/24/2013  . Allergic rhinitis 02/14/2013  . Depression with anxiety 02/14/2013  . Tobacco abuse 02/14/2013  . History of thyroid nodule 02/14/2013  . Migraines 02/14/2013  . Arthralgia 02/14/2013    Past Surgical History:  Procedure Laterality Date  . ANKLE SURGERY    . CESAREAN SECTION     2001 and 2006  . TUBAL LIGATION      OB History    No data available       Home Medications    Prior to Admission medications   Medication Sig Start Date End Date Taking? Authorizing Provider  amitriptyline (ELAVIL) 25 MG tablet Take 25 mg by mouth at bedtime.   Yes [provider]  DULoxetine (CYMBALTA) 20 MG capsule Take 20 mg by mouth daily.   Yes [provider]  pregabalin (LYRICA) 100 MG capsule Take 100 mg by mouth 2 (two) times daily.   Yes [provider]  diclofenac (VOLTAREN) 75 MG EC tablet Take 1 tablet (75 mg total) by mouth 2 (two) times daily. 11/20/16   Elson Areas, PA-C  HYDROcodone-acetaminophen (NORCO/VICODIN) 5-325 MG tablet Take 2 tablets by mouth every 4 (four) hours as needed. 11/20/16   Elson Areas, PA-C  naproxen (NAPROSYN) 500 MG tablet Take 1 tablet (500 mg total) by mouth 2 (two) times daily. 07/03/17   Horton, Mayer Masker, MD    Family History Family History  Problem Relation Age of Onset  . Hypertension Mother   . Heart disease Mother        CHF  . Hyperlipidemia Mother   . Seizures Father   . Hyperlipidemia Father   . Cancer Neg Hx   . Kidney disease Neg Hx   . Diabetes Neg Hx     Social History Social History  Substance Use Topics  . Smoking status: Current Every Day Smoker    Types: Cigarettes  . Smokeless tobacco: Never Used  . Alcohol use No     Allergies   Hydrocodone and  Tramadol   Review of Systems Review of Systems  Skin:       abscess  All other systems reviewed and are negative.    Physical Exam Updated Vital Signs BP 109/78 (BP Location: Right Arm)   Pulse 93   Temp 98.2 F (36.8 C) (Oral)   Resp 16   Ht  (1.575 m)   Wt 81.6 kg (180 lb)   LMP 07/06/2017   SpO2 100%   BMI 32.92 kg/m   Physical Exam  Constitutional: She is oriented to person, place, and time. She appears well-developed and well-nourished.  HENT:  Head: Normocephalic and atraumatic.  Mouth/Throat: Oropharynx is clear and moist.  Eyes: Pupils are equal, round, and reactive to light. Conjunctivae and EOM are normal.  Neck: Normal range of motion.  Cardiovascular: Normal rate, regular rhythm and normal heart sounds.   Pulmonary/Chest: Effort normal and breath sounds normal. No  respiratory distress. She has no wheezes.  Abdominal: Soft. Bowel sounds are normal. There is no tenderness. There is no rebound.  Genitourinary:     Musculoskeletal: Normal range of motion.  Neurological: She is alert and oriented to person, place, and time.  Skin: Skin is warm and dry.  Psychiatric: She has a normal mood and affect.  Nursing note and vitals reviewed.    ED Treatments / Results  Labs (all labs ordered are listed, but only abnormal results are displayed) Labs Reviewed  URINALYSIS, ROUTINE W REFLEX MICROSCOPIC - Abnormal; Notable for the following:       Result Value   Specific Gravity, Urine <1.005 (*)    Hgb urine dipstick MODERATE (*)    All other components within normal limits  URINALYSIS, MICROSCOPIC (REFLEX) - Abnormal; Notable for the following:    Bacteria, UA MANY (*)    Squamous Epithelial / LPF 0-5 (*)    All other components within normal limits  PREGNANCY, URINE    EKG  EKG Interpretation None       Radiology No results found.  Procedures Procedures (including critical care time)  Medications Ordered in ED Medications  clindamycin (CLEOCIN) capsule 300 mg (not administered)     Initial Impression / Assessment and Plan / ED Course  I have reviewed the triage vital signs and the nursing notes.  Pertinent labs & imaging results that were available during my care of the patient were reviewed by me and considered in my medical decision making (see chart for details).  36 year old female here with abscess of left labia majora. States she shaved and thought she had a "hair bump" but it has gotten larger.  On exam she does have a firm area to left lower labia majora without fluctuance or drainage.  No cellulitic changes.  No swelling of the groin.  Patient afebrile, non-toxic.  UA obtained-- many bacteria noted however patient denies currently urinary symptoms, abdominal pain, or flank pain.  Will hold off on treatment at this time.   Discussed risk/benefit of I&D here, patient would prefer trial of abx with continued warm compresses.  Feel this is reasonable.  She is aware if not improving, she may need to return for I&D.  Will start clindamycin.  Can follow-up with OB-GYN for ongoing care.  Discussed plan with patient, she acknowledged understanding and agreed with plan of care.  Return precautions given for new or worsening symptoms.  Final Clinical Impressions(s) / ED Diagnoses   Final diagnoses:  Abscess    New Prescriptions Discharge Medication List as of 08/27/2017  3:06 PM    START taking these medications   Details  clindamycin (CLEOCIN) 150 MG capsule Take 2 capsules (300 mg total) by mouth 3 (three) times daily. May dispense as  capsules, Starting Fri 08/27/2017, Print    oxyCODONE-acetaminophen (PERCOCET) 5-325 MG tablet Take 1 tablet by mouth every 4 (four) hours as needed., Starting Fri 08/27/2017, Print         Garlon Hatchet, PA-C 08/27/17 1523    Tegeler, Canary Brim, MD 08/27/17 1850

## 2017-08-27 NOTE — ED Triage Notes (Signed)
Vaginal abscess x 3 days

## 2017-08-27 NOTE — Discharge Instructions (Signed)
Take the prescribed medication as directed.  Can continue warm compresses/soaks at home. Follow-up with GYN-- can be seen at [facility]. Return to the ED for new or worsening symptoms.

## 2017-09-09 ENCOUNTER — Encounter: Payer: Self-pay | Attending: Psychology | Admitting: Psychology

## 2017-09-09 DIAGNOSIS — F4310 Post-traumatic stress disorder, unspecified: Secondary | ICD-10-CM | POA: Insufficient documentation

## 2017-09-09 DIAGNOSIS — G894 Chronic pain syndrome: Secondary | ICD-10-CM | POA: Insufficient documentation

## 2017-09-09 DIAGNOSIS — F322 Major depressive disorder, single episode, severe without psychotic features: Secondary | ICD-10-CM | POA: Insufficient documentation

## 2017-09-23 ENCOUNTER — Encounter (HOSPITAL_BASED_OUTPATIENT_CLINIC_OR_DEPARTMENT_OTHER): Payer: Self-pay | Admitting: Psychology

## 2017-09-23 DIAGNOSIS — G894 Chronic pain syndrome: Secondary | ICD-10-CM

## 2017-09-23 DIAGNOSIS — F4310 Post-traumatic stress disorder, unspecified: Secondary | ICD-10-CM

## 2017-09-23 DIAGNOSIS — F322 Major depressive disorder, single episode, severe without psychotic features: Secondary | ICD-10-CM
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
## 2017-12-02 ENCOUNTER — Encounter: Payer: PRIVATE HEALTH INSURANCE | Attending: Psychology | Admitting: Psychology

## 2018-08-25 ENCOUNTER — Encounter: Payer: Medicaid Other | Attending: Psychology | Admitting: Psychology

## 2018-09-08 ENCOUNTER — Other Ambulatory Visit: Payer: Self-pay

## 2018-09-08 ENCOUNTER — Encounter (HOSPITAL_BASED_OUTPATIENT_CLINIC_OR_DEPARTMENT_OTHER): Payer: Self-pay | Admitting: *Deleted

## 2018-09-08 ENCOUNTER — Emergency Department (HOSPITAL_BASED_OUTPATIENT_CLINIC_OR_DEPARTMENT_OTHER)
Admission: EM | Admit: 2018-09-08 | Discharge: 2018-09-08 | Disposition: A | Discharge status: Home / Self Care | Payer: Medicaid Other | Attending: Emergency Medicine | Admitting: Emergency Medicine

## 2018-09-08 DIAGNOSIS — Z5321 Procedure and treatment not carried out due to patient leaving prior to being seen by health care provider: Secondary | ICD-10-CM | POA: Insufficient documentation

## 2018-09-08 DIAGNOSIS — N898 Other specified noninflammatory disorders of vagina: Secondary | ICD-10-CM | POA: Insufficient documentation

## 2018-09-08 LAB — URINALYSIS, MICROSCOPIC (REFLEX)

## 2018-09-08 LAB — URINALYSIS, ROUTINE W REFLEX MICROSCOPIC
BILIRUBIN URINE: NEGATIVE
GLUCOSE, UA: NEGATIVE mg/dL
KETONES UR: NEGATIVE mg/dL
Leukocytes, UA: NEGATIVE
NITRITE: NEGATIVE
PROTEIN: NEGATIVE mg/dL
Specific Gravity, Urine: 1.02 (ref 1.005–1.030)
pH: 6 (ref 5.0–8.0)

## 2018-09-08 LAB — PREGNANCY, URINE: Preg Test, Ur: NEGATIVE

## 2018-09-08 NOTE — ED Triage Notes (Signed)
Pt o vaginal discharge x 1 week with lower abd pain and missed cycle

## 2019-11-08 ENCOUNTER — Other Ambulatory Visit: Payer: Self-pay

## 2019-11-08 ENCOUNTER — Encounter (HOSPITAL_BASED_OUTPATIENT_CLINIC_OR_DEPARTMENT_OTHER): Payer: Self-pay | Admitting: *Deleted

## 2019-11-08 ENCOUNTER — Emergency Department (HOSPITAL_BASED_OUTPATIENT_CLINIC_OR_DEPARTMENT_OTHER)
Admission: EM | Admit: 2019-11-08 | Discharge: 2019-11-08 | Disposition: A | Discharge status: Home / Self Care | Payer: Medicaid Other | Attending: Emergency Medicine | Admitting: Emergency Medicine

## 2019-11-08 DIAGNOSIS — Z79899 Other long term (current) drug therapy: Secondary | ICD-10-CM | POA: Diagnosis not present

## 2019-11-08 DIAGNOSIS — N760 Acute vaginitis: Secondary | ICD-10-CM | POA: Diagnosis not present

## 2019-11-08 DIAGNOSIS — A599 Trichomoniasis, unspecified: Secondary | ICD-10-CM | POA: Diagnosis not present

## 2019-11-08 DIAGNOSIS — N898 Other specified noninflammatory disorders of vagina: Secondary | ICD-10-CM | POA: Diagnosis present

## 2019-11-08 DIAGNOSIS — B9689 Other specified bacterial agents as the cause of diseases classified elsewhere: Secondary | ICD-10-CM | POA: Diagnosis not present

## 2019-11-08 LAB — URINALYSIS, MICROSCOPIC (REFLEX)

## 2019-11-08 LAB — URINALYSIS, ROUTINE W REFLEX MICROSCOPIC
Bilirubin Urine: NEGATIVE
Glucose, UA: NEGATIVE mg/dL
Ketones, ur: NEGATIVE mg/dL
Nitrite: NEGATIVE
Protein, ur: NEGATIVE mg/dL
Specific Gravity, Urine: 1.01 (ref 1.005–1.030)
pH: 7 (ref 5.0–8.0)

## 2019-11-08 LAB — WET PREP, GENITAL
Sperm: NONE SEEN
Yeast Wet Prep HPF POC: NONE SEEN

## 2019-11-08 LAB — PREGNANCY, URINE: Preg Test, Ur: NEGATIVE

## 2019-11-08 MED ORDER — CEFTRIAXONE SODIUM 250 MG IJ SOLR
250.0000 mg | Freq: Once | INTRAMUSCULAR | Status: AC
  Administered 2019-11-08: 250 mg via INTRAMUSCULAR
  Filled 2019-11-08: qty 250

## 2019-11-08 MED ORDER — METRONIDAZOLE 500 MG PO TABS: 500.0000 mg | ORAL_TABLET | Freq: Two times a day (BID) | ORAL | 0 refills | Status: DC

## 2019-11-08 MED ORDER — AZITHROMYCIN 250 MG PO TABS
1000.0000 mg | ORAL_TABLET | Freq: Once | ORAL | Status: AC
  Administered 2019-11-08: 18:00:00 1000 mg via ORAL
  Filled 2019-11-08: qty 4

## 2019-11-08 MED ORDER — LIDOCAINE HCL (PF) 1 % IJ SOLN
INTRAMUSCULAR | Status: AC
  Administered 2019-11-08: 18:00:00 1.2 mL
  Filled 2019-11-08: qty 5

## 2019-11-08 NOTE — ED Provider Notes (Signed)
[facility] EMERGENCY DEPARTMENT Provider Note   CSN: [id number] Arrival date & time: 11/08/19  1730     History   Chief Complaint Chief Complaint  Patient presents with  . Vaginal Discharge    HPI Kayla Jenkins is a 38 y.o. female.     Patient c/o vaginal discharge for past 3 days. Symptoms acute onset, moderate, persistent. Denies vaginal or pelvic pain. lnmp 1 month ago. Denies bleeding. No abd pain or nvd. No dysuria. No back/flank pain. No fever or chills. States partner told her today he had trichomonas.   The history is provided by the patient.  Vaginal Discharge Associated symptoms: no abdominal pain, no dysuria, no fever, no nausea and no vomiting     Past Medical History:  Diagnosis Date  . Back pain   . Migraine   . Multiple thyroid nodules 2009  . PTSD (post-traumatic stress disorder)   . Sciatica     Patient Active Problem List   Diagnosis Date Noted  . UTI symptoms 02/14/2014  . Vaginitis and vulvovaginitis 12/11/2013  . Infection of urinary tract 12/11/2013  . Right leg pain 07/24/2013  . Neck pain 06/26/2013  . Muscle strain, shoulder region 06/20/2013  . H/O thyroid nodule 03/26/2013  . Irregular menses 03/24/2013  . Allergic rhinitis 02/14/2013  . Depression with anxiety 02/14/2013  . Tobacco abuse 02/14/2013  . History of thyroid nodule 02/14/2013  . Migraines 02/14/2013  . Arthralgia 02/14/2013    Past Surgical History:  Procedure Laterality Date  . ANKLE SURGERY    . CESAREAN SECTION     2001 and 2006  . TUBAL LIGATION       OB History   No obstetric history on file.      Home Medications    Prior to Admission medications   Medication Sig Start Date End Date Taking? Authorizing Provider  amitriptyline (ELAVIL) 25 MG tablet Take 25 mg by mouth at bedtime.    [provider]  diclofenac (VOLTAREN) 75 MG EC tablet Take 1 tablet (75 mg total) by mouth 2 (two) times daily. 11/20/16   Fransico Meadow, PA-C   DULoxetine (CYMBALTA) 20 MG capsule Take 20 mg by mouth daily.    [provider]  HYDROcodone-acetaminophen (NORCO/VICODIN) 5-325 MG tablet Take 2 tablets by mouth every 4 (four) hours as needed. 11/20/16   Fransico Meadow, PA-C  naproxen (NAPROSYN) 500 MG tablet Take 1 tablet (500 mg total) by mouth 2 (two) times daily. 07/03/17   Horton, Barbette Hair, MD  oxyCODONE-acetaminophen (PERCOCET) 5-325 MG tablet Take 1 tablet by mouth every 4 (four) hours as needed. 08/27/17   Larene Pickett, PA-C  pregabalin (LYRICA) 100 MG capsule Take 100 mg by mouth 2 (two) times daily.    [provider]    Family History Family History  Problem Relation Age of Onset  . Hypertension Mother   . Heart disease Mother        CHF  . Hyperlipidemia Mother   . Seizures Father   . Hyperlipidemia Father   . Cancer Neg Hx   . Kidney disease Neg Hx   . Diabetes Neg Hx     Social History Social History   Tobacco Use  . Smoking status: Current Every Day Smoker    Types: Cigarettes  . Smokeless tobacco: Never Used  Substance Use Topics  . Alcohol use: No  . Drug use: No     Allergies   Hydrocodone and Tramadol  Review of Systems Review of Systems  Constitutional: Negative for fever.  HENT: Negative for sore throat.   Respiratory: Negative for cough.   Gastrointestinal: Negative for abdominal pain, nausea and vomiting.  Genitourinary: Positive for vaginal discharge. Negative for dysuria and pelvic pain.  Skin: Negative for rash.     Physical Exam Updated Vital Signs BP 139/88   Pulse 86   Temp 98.6 F (37 C)   Resp 16   Ht 1.575 m (5\' 2" )   Wt 81.6 kg   LMP 11/01/2019   SpO2 99%   BMI 32.92 kg/m   Physical Exam Vitals signs and nursing note reviewed.  Constitutional:      Appearance: Normal appearance. She is well-developed.  HENT:     Head: Atraumatic.     Nose: Nose normal.     Mouth/Throat:     Mouth: Mucous membranes are moist.  Eyes:     General: No  scleral icterus.    Conjunctiva/sclera: Conjunctivae normal.  Neck:     Musculoskeletal: Neck supple.     Trachea: No tracheal deviation.  Cardiovascular:     Rate and Rhythm: Normal rate.     Pulses: Normal pulses.     Heart sounds: No friction rub.  Pulmonary:     Effort: Pulmonary effort is normal. No respiratory distress.  Abdominal:     General: Bowel sounds are normal. There is no distension.     Palpations: Abdomen is soft.     Tenderness: There is no abdominal tenderness. There is no guarding.  Genitourinary:    Comments: No cva tenderness. Normal external gu exam. Mild-mod whitish vaginal discharge. Cervix closed, no cmt. No adnexal masses or tenderness.  Musculoskeletal:        General: No swelling.  Skin:    General: Skin is warm and dry.     Findings: No rash.  Neurological:     Mental Status: She is alert.     Comments: Alert, speech normal.   Psychiatric:        Mood and Affect: Mood normal.      ED Treatments / Results  Labs (all labs ordered are listed, but only abnormal results are displayed) Results for orders placed or performed during the hospital encounter of 11/08/19  Wet prep, genital   Specimen: PATH Cytology Cervicovaginal Ancillary Only  Result Value Ref Range   Yeast Wet Prep HPF POC NONE SEEN NONE SEEN   Trich, Wet Prep PRESENT (A) NONE SEEN   Clue Cells Wet Prep HPF POC PRESENT (A) NONE SEEN   WBC, Wet Prep HPF POC MODERATE (A) NONE SEEN   Sperm NONE SEEN   Urinalysis, Routine w reflex microscopic  Result Value Ref Range   Color, Urine YELLOW YELLOW   APPearance CLEAR CLEAR   Specific Gravity, Urine 1.010 1.005 - 1.030   pH 7.0 5.0 - 8.0   Glucose, UA NEGATIVE NEGATIVE mg/dL   Hgb urine dipstick TRACE (A) NEGATIVE   Bilirubin Urine NEGATIVE NEGATIVE   Ketones, ur NEGATIVE NEGATIVE mg/dL   Protein, ur NEGATIVE NEGATIVE mg/dL   Nitrite NEGATIVE NEGATIVE   Leukocytes,Ua TRACE (A) NEGATIVE  Pregnancy, urine  Result Value Ref Range    Preg Test, Ur NEGATIVE NEGATIVE  Urinalysis, Microscopic (reflex)  Result Value Ref Range   RBC / HPF 0-5 0 - 5 RBC/hpf   WBC, UA 0-5 0 - 5 WBC/hpf   Bacteria, UA RARE (A) NONE SEEN   Squamous Epithelial / LPF 0-5 0 -  5    EKG None  Radiology No results found.  Procedures Procedures (including critical care time)  Medications Ordered in ED Medications - No data to display   Initial Impression / Assessment and Plan / ED Course  I have reviewed the triage vital signs and the nursing notes.  Pertinent labs & imaging results that were available during my care of the patient were reviewed by me and considered in my medical decision making (see chart for details).  Labs sent.   Reviewed nursing notes and prior charts for additional history.   Labs reviewed/interpreted by me - preg negative.  Patients notes partner with trich and requests tx for possible std. No antibiotic allergies confirmed w pt.   Rocephin im. zithromax po. Flagyl po.   Additional labs reviewed/interpreted by me - trich and clue cells positive.  rx for home, flagyl.     Final Clinical Impressions(s) / ED Diagnoses   Final diagnoses:  None    ED Discharge Orders    None       Cathren Laine, MD 11/08/19 [phone]

## 2019-11-08 NOTE — Discharge Instructions (Addendum)
It was our pleasure to provide your ER care today - we hope that you feel better.  Take antibiotic (flagyl) as prescribed. Make sure not to drink alcohol when taking this antibiotic.   Follow up with primary care doctor in 1 week if symptoms fail to improve/resolve.  Return to  ER if worse, new symptoms, fevers, new or severe abdominal or pelvic pain, or other concern.

## 2019-11-08 NOTE — ED Triage Notes (Signed)
Pt c/o vaginal discharge x 3 days, exposure to trich

## 2019-11-10 LAB — GC/CHLAMYDIA PROBE AMP (~~LOC~~) NOT AT ARMC
Chlamydia: NEGATIVE
Neisseria Gonorrhea: NEGATIVE

## 2019-12-25 ENCOUNTER — Other Ambulatory Visit: Payer: Self-pay

## 2019-12-25 ENCOUNTER — Emergency Department (HOSPITAL_BASED_OUTPATIENT_CLINIC_OR_DEPARTMENT_OTHER)
Admission: EM | Admit: 2019-12-25 | Discharge: 2019-12-25 | Disposition: A | Discharge status: Home / Self Care | Payer: Medicaid Other | Attending: Emergency Medicine | Admitting: Emergency Medicine

## 2019-12-25 ENCOUNTER — Encounter (HOSPITAL_BASED_OUTPATIENT_CLINIC_OR_DEPARTMENT_OTHER): Payer: Self-pay | Admitting: *Deleted

## 2019-12-25 DIAGNOSIS — Z885 Allergy status to narcotic agent status: Secondary | ICD-10-CM | POA: Diagnosis not present

## 2019-12-25 DIAGNOSIS — F17210 Nicotine dependence, cigarettes, uncomplicated: Secondary | ICD-10-CM | POA: Diagnosis not present

## 2019-12-25 DIAGNOSIS — N899 Noninflammatory disorder of vagina, unspecified: Secondary | ICD-10-CM | POA: Diagnosis present

## 2019-12-25 DIAGNOSIS — Z79899 Other long term (current) drug therapy: Secondary | ICD-10-CM | POA: Diagnosis not present

## 2019-12-25 DIAGNOSIS — N760 Acute vaginitis: Secondary | ICD-10-CM | POA: Diagnosis not present

## 2019-12-25 DIAGNOSIS — B9689 Other specified bacterial agents as the cause of diseases classified elsewhere: Secondary | ICD-10-CM

## 2019-12-25 LAB — URINALYSIS, ROUTINE W REFLEX MICROSCOPIC
Bilirubin Urine: NEGATIVE
Glucose, UA: NEGATIVE mg/dL
Ketones, ur: NEGATIVE mg/dL
Leukocytes,Ua: NEGATIVE
Nitrite: NEGATIVE
Protein, ur: NEGATIVE mg/dL
Specific Gravity, Urine: >1.03 — ABNORMAL HIGH (ref 1.005–1.030)
pH: 6 (ref 5.0–8.0)

## 2019-12-25 LAB — WET PREP, GENITAL
Sperm: NONE SEEN
Trich, Wet Prep: NONE SEEN
Yeast Wet Prep HPF POC: NONE SEEN

## 2019-12-25 LAB — URINALYSIS, MICROSCOPIC (REFLEX)

## 2019-12-25 LAB — PREGNANCY, URINE: Preg Test, Ur: NEGATIVE

## 2019-12-25 MED ORDER — METRONIDAZOLE 500 MG PO TABS: 500.0000 mg | ORAL_TABLET | Freq: Two times a day (BID) | ORAL | 0 refills | Status: DC

## 2019-12-25 MED ORDER — METRONIDAZOLE 500 MG PO TABS
500.0000 mg | ORAL_TABLET | Freq: Once | ORAL | Status: AC
  Administered 2019-12-25: 500 mg via ORAL
  Filled 2019-12-25: qty 1

## 2019-12-25 NOTE — Discharge Instructions (Signed)
As discussed, your wet prep was positive for bacterial vaginosis. I am sending you home with an antibiotic. Take twice a day for 7 days. Finish all antibiotics. Do not drink while on the medication. You gonorrhea and chlamydia tests are pending. You will receive a phone call if they are positive. If you symptoms do not improve within the next week, follow-up with your OBGYN. Return to the ER for new or worsening symptoms.

## 2019-12-25 NOTE — ED Provider Notes (Signed)
[facility] EMERGENCY DEPARTMENT Provider Note   CSN: [id number] Arrival date & time: 12/25/19  1819     History Chief Complaint  Patient presents with  . Vaginal Discharge    Kayla Jenkins is a 39 y.o. female with a past medical history significant for PTSD, thyroid nodules, and history of migraines who presents to the ED due to vaginal discharge x2 days.  Patient states she has an increased amount of discharge which she describes as "cottage cheese" associated with vaginal irritation and pruritus.  Patient states she has a history of frequent yeast infections which she notes feels similar to this episode.  Vaginal discharge is also associated with suprapubic pain.  Patient rates pain 7/10 and intermittent in nature. Patient states pain feels like she's "holding in her urine" but then attempts to use the bathroom where only a little bit of urine comes out. Patient is currently sexually active with one partner unprotected.  Patient denies concerns for STDs.  Patient denies fever, chills, nausea, vomiting, diarrhea.  Patient denies history of STDs.  Patient has not tried anything over-the-counter for her symptoms.    Past Medical History:  Diagnosis Date  . Back pain   . Migraine   . Multiple thyroid nodules 2009  . PTSD (post-traumatic stress disorder)   . Sciatica     Patient Active Problem List   Diagnosis Date Noted  . UTI symptoms 02/14/2014  . Vaginitis and vulvovaginitis 12/11/2013  . Infection of urinary tract 12/11/2013  . Right leg pain 07/24/2013  . Neck pain 06/26/2013  . Muscle strain, shoulder region 06/20/2013  . H/O thyroid nodule 03/26/2013  . Irregular menses 03/24/2013  . Allergic rhinitis 02/14/2013  . Depression with anxiety 02/14/2013  . Tobacco abuse 02/14/2013  . History of thyroid nodule 02/14/2013  . Migraines 02/14/2013  . Arthralgia 02/14/2013    Past Surgical History:  Procedure Laterality Date  . ANKLE SURGERY    . CESAREAN  SECTION     2001 and 2006  . TUBAL LIGATION       OB History   No obstetric history on file.     Family History  Problem Relation Age of Onset  . Hypertension Mother   . Heart disease Mother        CHF  . Hyperlipidemia Mother   . Seizures Father   . Hyperlipidemia Father   . Cancer Neg Hx   . Kidney disease Neg Hx   . Diabetes Neg Hx     Social History   Tobacco Use  . Smoking status: Current Every Day Smoker    Types: Cigarettes  . Smokeless tobacco: Never Used  Substance Use Topics  . Alcohol use: No  . Drug use: No    Home Medications Prior to Admission medications   Medication Sig Start Date End Date Taking? Authorizing Provider  pregabalin (LYRICA) 100 MG capsule Take 100 mg by mouth 2 (two) times daily.   Yes [provider]  metroNIDAZOLE (FLAGYL) 500 MG tablet Take 1 tablet (500 mg total) by mouth 2 (two) times daily. 11/08/19   Cathren Laine, MD  metroNIDAZOLE (FLAGYL) 500 MG tablet Take 1 tablet (500 mg total) by mouth 2 (two) times daily. 12/25/19   Mannie Stabile, PA-C    Allergies    Hydrocodone and Tramadol  Review of Systems   Review of Systems  Constitutional: Negative for chills and fever.  Gastrointestinal: Positive for abdominal pain (suprapubic). Negative for diarrhea, nausea and vomiting.  Genitourinary: Positive for decreased urine volume, pelvic pain (subprapubic) and vaginal discharge. Negative for dysuria, flank pain, hematuria, vaginal bleeding and vaginal pain.  Musculoskeletal: Negative for back pain.  All other systems reviewed and are negative.   Physical Exam Updated Vital Signs BP 124/86   Pulse 91   Temp 99.2 F (37.3 C) (Oral)   Resp 14   Ht 5\' 2"  (1.575 m)   Wt 88.9 kg   SpO2 100%   BMI 35.85 kg/m   Physical Exam Vitals and nursing note reviewed.  Constitutional:      General: She is not in acute distress.    Appearance: She is not ill-appearing.  HENT:     Head: Normocephalic.  Eyes:      Conjunctiva/sclera: Conjunctivae normal.  Cardiovascular:     Rate and Rhythm: Normal rate and regular rhythm.     Pulses: Normal pulses.     Heart sounds: Normal heart sounds. No murmur. No friction rub. No gallop.   Pulmonary:     Effort: Pulmonary effort is normal.     Breath sounds: Normal breath sounds.  Abdominal:     General: Abdomen is flat. Bowel sounds are normal. There is no distension.     Palpations: Abdomen is soft.     Tenderness: There is no abdominal tenderness. There is no right CVA tenderness, left CVA tenderness, guarding or rebound.     Comments: Abdomen soft, nondistended, nontender to palpation in all quadrants without guarding or peritoneal signs. No rebound. Negative CVA tenderness bilaterally.  Genitourinary:    Comments: Normal appearing external female genitalia without rashes or lesions, normal vaginal epithelium. Normal appearing cervix without petechiae. Mild amount of discharge. Cervical os is closed. There is no bleeding noted at the os. Bimanual: No CMT, nontender uterus.  No palpable adnexal masses or tenderness. Uterus midline and not fixed. No cystocele or rectocele noted. No pelvic lymphadenopathy noted. Wet prep was obtained.  Cultures for gonorrhea and chlamydia collected. Exam performed with chaperone in room. Musculoskeletal:     Cervical back: Neck supple.     Comments: Able to move all 4 extremities without difficulty. No lower extremity edema.   Skin:    General: Skin is warm and dry.  Neurological:     General: No focal deficit present.     Mental Status: She is alert.     ED Results / Procedures / Treatments   Labs (all labs ordered are listed, but only abnormal results are displayed) Labs Reviewed  WET PREP, GENITAL - Abnormal; Notable for the following components:      Result Value   Clue Cells Wet Prep HPF POC PRESENT (*)    WBC, Wet Prep HPF POC FEW (*)    All other components within normal limits  URINALYSIS, ROUTINE W REFLEX  MICROSCOPIC - Abnormal; Notable for the following components:   Specific Gravity, Urine >1.030 (*)    Hgb urine dipstick SMALL (*)    All other components within normal limits  URINALYSIS, MICROSCOPIC (REFLEX) - Abnormal; Notable for the following components:   Bacteria, UA FEW (*)    All other components within normal limits  PREGNANCY, URINE  GC/CHLAMYDIA PROBE AMP ([place]) NOT AT [facility]    EKG None  Radiology No results found.  Procedures Procedures (including critical care time)  Medications Ordered in ED Medications - No data to display  ED Course  I have reviewed the triage vital signs and the nursing notes.  Pertinent labs & imaging  results that were available during my care of the patient were reviewed by me and considered in my medical decision making (see chart for details).    MDM Rules/Calculators/A&P                      39 year old female presents to the ED for evaluation of vaginal discharge x2 days associated with irritation and pruritus.  Patient is currently sexually active with one partner unprotected.  Vitals all within normal limits.  Patient no acute distress and not ill-appearing.  Abdomen soft, nondistended, nontender.  Pelvic exam performed with chaperone in room which is significant for mild amount of vaginal discharge.  No CMT. No concern for PID. Will obtain wet prep, GC/Chlamydia, and UA.  UA significant for small amounts of hematuria but no signs of infection.  Urine pregnancy test negative.  Wet prep positive for clue cells with few white blood cells indicative of bacterial vaginosis. GC/Chlamydia pending. Will discharge patient with Flagyl. Instructed patient not to drink alcohol while on medication. Patient instructed to follow-up with OBGYN if symptoms do not improve. Strict ED precautions discussed with patient. Patient states understanding and agrees to plan. Patient discharged home in no acute distress and stable vitals  Final Clinical  Impression(s) / ED Diagnoses Final diagnoses:  Bacterial vaginosis    Rx / DC Orders ED Discharge Orders         Ordered    metroNIDAZOLE (FLAGYL) 500 MG tablet  2 times daily     12/25/19 2145           Jesusita Oka 12/25/19 2152    Terald Sleeper, MD 12/26/19 1137

## 2019-12-25 NOTE — ED Triage Notes (Signed)
Vaginal discharge x 2 days.  

## 2019-12-27 LAB — GC/CHLAMYDIA PROBE AMP (~~LOC~~) NOT AT ARMC
Chlamydia: NEGATIVE
Neisseria Gonorrhea: NEGATIVE

## 2020-10-11 ENCOUNTER — Other Ambulatory Visit: Payer: Self-pay

## 2020-10-11 ENCOUNTER — Emergency Department (HOSPITAL_BASED_OUTPATIENT_CLINIC_OR_DEPARTMENT_OTHER)
Admission: EM | Admit: 2020-10-11 | Discharge: 2020-10-12 | Disposition: A | Discharge status: Home / Self Care | Payer: Medicaid Other | Attending: Emergency Medicine | Admitting: Emergency Medicine

## 2020-10-11 ENCOUNTER — Encounter (HOSPITAL_BASED_OUTPATIENT_CLINIC_OR_DEPARTMENT_OTHER): Payer: Self-pay | Admitting: Emergency Medicine

## 2020-10-11 DIAGNOSIS — G894 Chronic pain syndrome: Secondary | ICD-10-CM

## 2020-10-11 DIAGNOSIS — M79661 Pain in right lower leg: Secondary | ICD-10-CM | POA: Insufficient documentation

## 2020-10-11 DIAGNOSIS — F17210 Nicotine dependence, cigarettes, uncomplicated: Secondary | ICD-10-CM | POA: Insufficient documentation

## 2020-10-11 DIAGNOSIS — Y92410 Unspecified street and highway as the place of occurrence of the external cause: Secondary | ICD-10-CM | POA: Insufficient documentation

## 2020-10-11 DIAGNOSIS — M545 Low back pain, unspecified: Secondary | ICD-10-CM | POA: Diagnosis not present
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
## 2020-10-12 ENCOUNTER — Emergency Department (HOSPITAL_BASED_OUTPATIENT_CLINIC_OR_DEPARTMENT_OTHER): Payer: Medicaid Other

## 2020-10-12 NOTE — ED Notes (Signed)
Patient transported to X-ray 

## 2020-10-12 NOTE — ED Notes (Signed)
Pt verbalized understanding of f/u info, able to ambulate w crutches & left ED w/o issue.

## 2021-01-27 IMAGING — DX DG ANKLE COMPLETE 3+V*R*
3 series · 3 of 3 positions shown · non-contrast
Comparison: CT dated October 21, 2016.

CLINICAL DATA: Pain

EXAM:
RIGHT ANKLE - COMPLETE 3+ VIEW

[ankle ap]
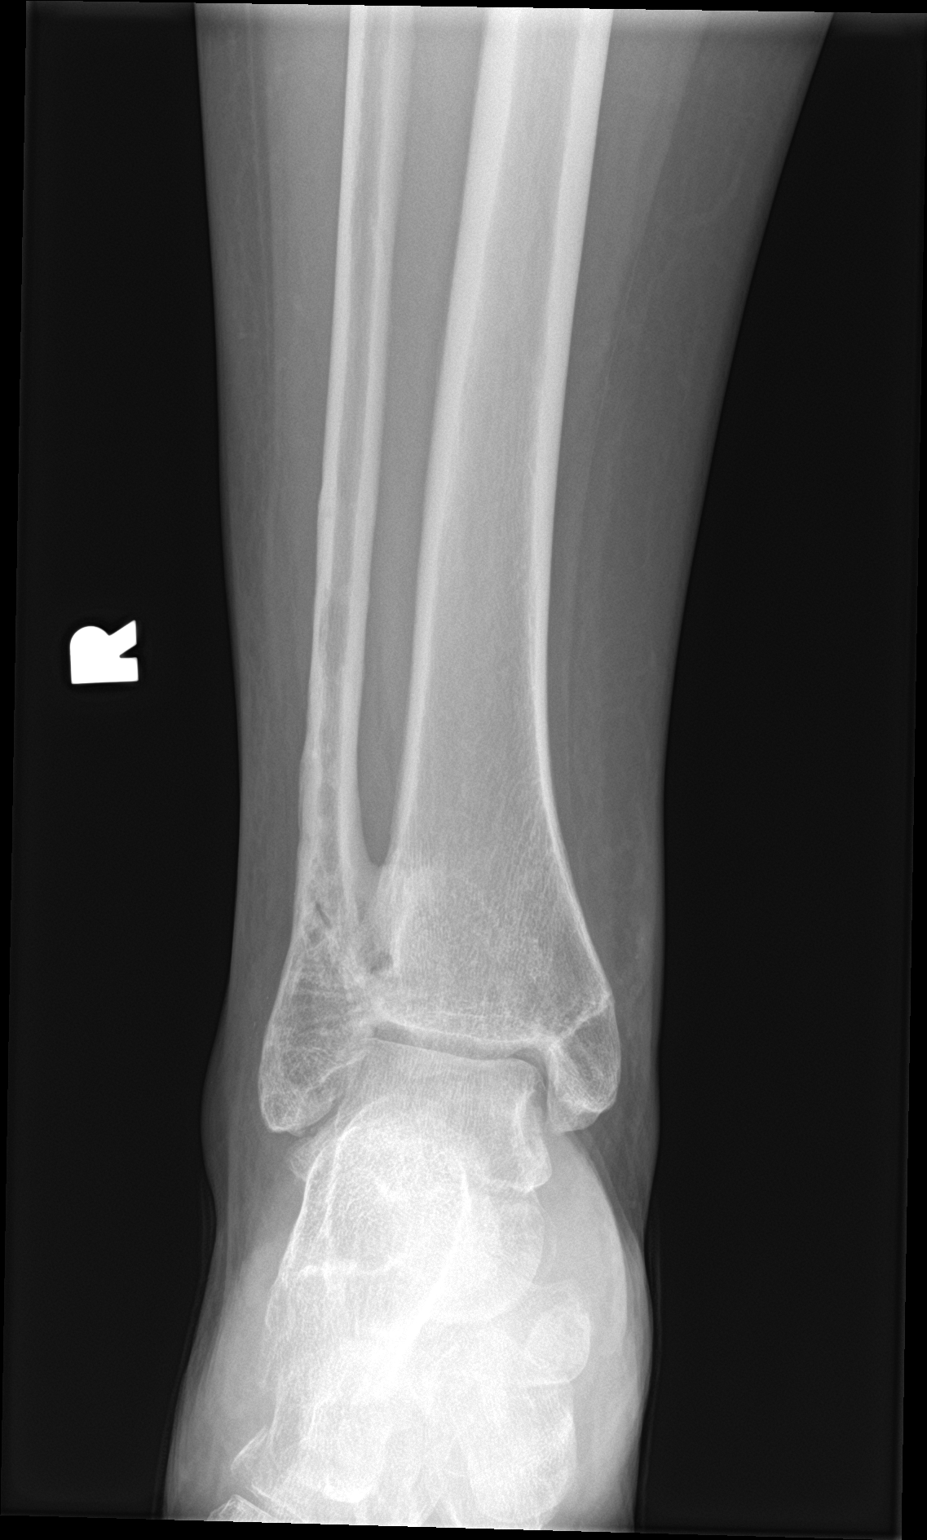

[ankle obl]
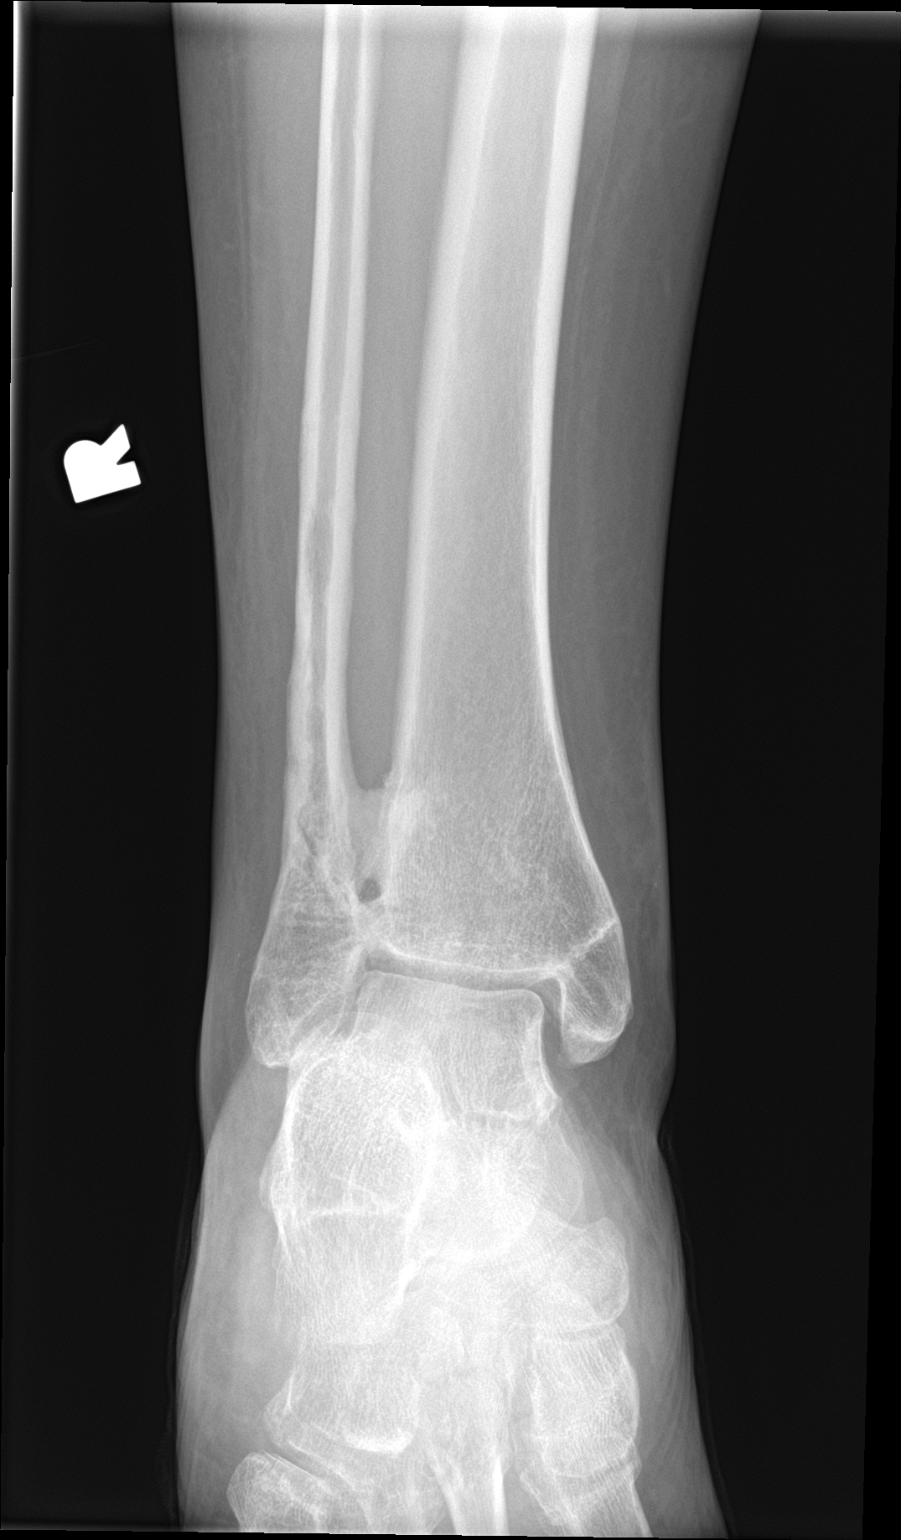

[ankle lat]
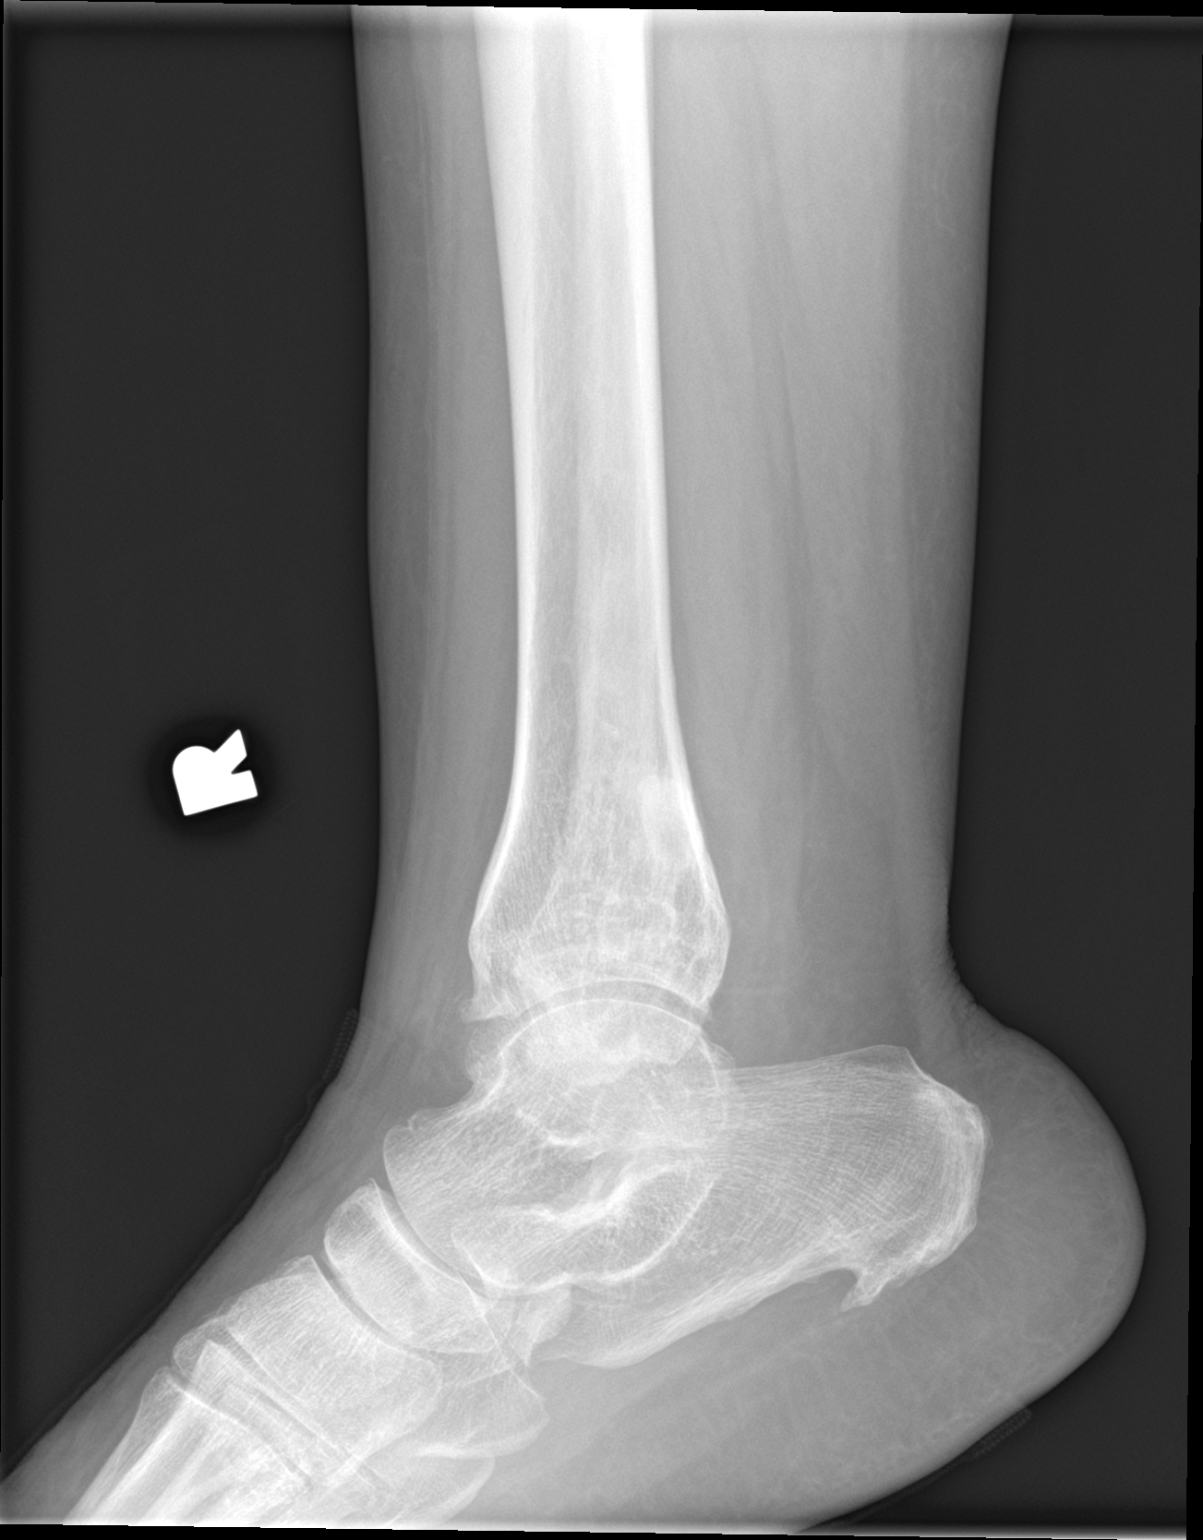

[3 of 3 positions shown; findings below may reference images not displayed]

FINDINGS: Again noted is osseous bridging across the syndesmosis. There is
mild surrounding soft tissue swelling. There is no definite acute
displaced fracture or dislocation. There are degenerative changes of
the ankle mortise. There is a moderate-sized plantar calcaneal spur.
IMPRESSION: 1. No acute displaced fracture or dislocation.
2. Mild soft tissue swelling about the ankle.

## 2021-06-03 ENCOUNTER — Other Ambulatory Visit: Payer: Self-pay

## 2021-06-03 DIAGNOSIS — Z202 Contact with and (suspected) exposure to infections with a predominantly sexual mode of transmission: Secondary | ICD-10-CM | POA: Diagnosis not present

## 2021-06-03 DIAGNOSIS — F17210 Nicotine dependence, cigarettes, uncomplicated: Secondary | ICD-10-CM | POA: Diagnosis not present

## 2021-06-04 ENCOUNTER — Encounter (HOSPITAL_BASED_OUTPATIENT_CLINIC_OR_DEPARTMENT_OTHER): Payer: Self-pay | Admitting: *Deleted

## 2021-06-04 ENCOUNTER — Other Ambulatory Visit: Payer: Self-pay

## 2021-06-04 ENCOUNTER — Emergency Department (HOSPITAL_BASED_OUTPATIENT_CLINIC_OR_DEPARTMENT_OTHER)
Admission: EM | Admit: 2021-06-04 | Discharge: 2021-06-04 | Disposition: A | Discharge status: Home / Self Care | Payer: Medicaid Other | Attending: Emergency Medicine | Admitting: Emergency Medicine

## 2021-06-04 DIAGNOSIS — Z711 Person with feared health complaint in whom no diagnosis is made: Secondary | ICD-10-CM

## 2021-06-04 LAB — WET PREP, GENITAL
Sperm: NONE SEEN
Trich, Wet Prep: NONE SEEN
Yeast Wet Prep HPF POC: NONE SEEN

## 2021-06-04 LAB — URINALYSIS, ROUTINE W REFLEX MICROSCOPIC
Bilirubin Urine: NEGATIVE
Glucose, UA: NEGATIVE mg/dL
Ketones, ur: NEGATIVE mg/dL
Leukocytes,Ua: NEGATIVE
Nitrite: NEGATIVE
Protein, ur: NEGATIVE mg/dL
Specific Gravity, Urine: 1.025 (ref 1.005–1.030)
pH: 6 (ref 5.0–8.0)

## 2021-06-04 LAB — URINALYSIS, MICROSCOPIC (REFLEX): RBC / HPF: >50 RBC/hpf (ref 0–5)

## 2021-06-04 LAB — PREGNANCY, URINE: Preg Test, Ur: NEGATIVE

## 2021-06-04 MED ORDER — AZITHROMYCIN 1 G PO PACK
1.0000 g | PACK | Freq: Once | ORAL | Status: AC
  Administered 2021-06-04: 1 g via ORAL
  Filled 2021-06-04: qty 1

## 2021-06-04 MED ORDER — CEFTRIAXONE SODIUM 1 G IJ SOLR
1.0000 g | Freq: Once | INTRAMUSCULAR | Status: AC
  Administered 2021-06-04: 1 g via INTRAMUSCULAR
  Filled 2021-06-04: qty 10

## 2021-06-04 MED ORDER — METRONIDAZOLE 500 MG PO TABS
2000.0000 mg | ORAL_TABLET | Freq: Once | ORAL | Status: AC
  Administered 2021-06-04: 2000 mg via ORAL
  Filled 2021-06-04: qty 4

## 2021-06-04 NOTE — ED Notes (Signed)
Did not want discharge vitals taken, only temp.

## 2021-06-04 NOTE — ED Triage Notes (Signed)
C/o vaginal discharge x 5 days, exposed to STD

## 2021-06-04 NOTE — ED Provider Notes (Signed)
[facility] EMERGENCY DEPARTMENT Provider Note   CSN: [id number] Arrival date & time: 06/03/21  2358     History Chief Complaint  Patient presents with   Vaginal Discharge    Kayla Jenkins is a 40 y.o. female.  The history is provided by the patient.  Exposure to STD This is a new problem. The current episode started more than 2 days ago. The problem occurs constantly. The problem has not changed since onset.Pertinent negatives include no chest pain, no abdominal pain, no headaches and no shortness of breath. Nothing aggravates the symptoms. Nothing relieves the symptoms. She has tried nothing for the symptoms. The treatment provided no relief.  States she was having discharge and states partner was treated for trichomonas here earlier today.     Past Medical History:  Diagnosis Date   Back pain    Migraine    Multiple thyroid nodules 2009   PTSD (post-traumatic stress disorder)    Sciatica     Patient Active Problem List   Diagnosis Date Noted   UTI symptoms 02/14/2014   Vaginitis and vulvovaginitis 12/11/2013   Infection of urinary tract 12/11/2013   Right leg pain 07/24/2013   Neck pain 06/26/2013   Muscle strain, shoulder region 06/20/2013   H/O thyroid nodule 03/26/2013   Irregular menses 03/24/2013   Allergic rhinitis 02/14/2013   Depression with anxiety 02/14/2013   Tobacco abuse 02/14/2013   History of thyroid nodule 02/14/2013   Migraines 02/14/2013   Arthralgia 02/14/2013    Past Surgical History:  Procedure Laterality Date   ANKLE SURGERY     CESAREAN SECTION     2001 and 2006   TUBAL LIGATION       OB History   No obstetric history on file.     Family History  Problem Relation Age of Onset   Hypertension Mother    Heart disease Mother        CHF   Hyperlipidemia Mother    Seizures Father    Hyperlipidemia Father    Cancer Neg Hx    Kidney disease Neg Hx    Diabetes Neg Hx     Social History   Tobacco Use   Smoking  status: Every Day    Pack years: 0.00    Types: Cigarettes   Smokeless tobacco: Never  Substance Use Topics   Alcohol use: No   Drug use: No    Home Medications Prior to Admission medications   Medication Sig Start Date End Date Taking? Authorizing Provider  pregabalin (LYRICA) 100 MG capsule Take 100 mg by mouth 2 (two) times daily.  10/12/20  [provider]    Allergies    Hydrocodone and Tramadol  Review of Systems   Review of Systems  Constitutional:  Negative for fever.  HENT:  Negative for facial swelling.   Eyes:  Negative for redness.  Respiratory:  Negative for shortness of breath.   Cardiovascular:  Negative for chest pain.  Gastrointestinal:  Negative for abdominal pain.  Genitourinary:  Positive for vaginal discharge.  Musculoskeletal:  Negative for neck stiffness.  Skin:  Negative for rash.  Neurological:  Negative for headaches.  Psychiatric/Behavioral:  Negative for agitation.   All other systems reviewed and are negative.  Physical Exam Updated Vital Signs BP 125/78 (BP Location: Left Arm)   Pulse 94   Temp 98.2 F (36.8 C) (Oral)   Resp 18   Ht 5\' 2"  (1.575 m)   Wt 83.9 kg  LMP 06/01/2021   SpO2 100%   BMI 33.84 kg/m   Physical Exam Vitals and nursing note reviewed.  Constitutional:      General: She is not in acute distress.    Appearance: Normal appearance.  HENT:     Head: Normocephalic and atraumatic.     Nose: Nose normal.  Eyes:     Extraocular Movements: Extraocular movements intact.     Conjunctiva/sclera: Conjunctivae normal.     Pupils: Pupils are equal, round, and reactive to light.  Cardiovascular:     Rate and Rhythm: Normal rate and regular rhythm.     Pulses: Normal pulses.     Heart sounds: Normal heart sounds.  Pulmonary:     Effort: Pulmonary effort is normal.     Breath sounds: Normal breath sounds.  Abdominal:     General: Abdomen is flat. Bowel sounds are normal.     Palpations: Abdomen is soft.      Tenderness: There is no abdominal tenderness. There is no guarding.  Musculoskeletal:        General: Normal range of motion.     Cervical back: Normal range of motion and neck supple.  Skin:    General: Skin is warm and dry.     Capillary Refill: Capillary refill takes less than 2 seconds.  Neurological:     General: No focal deficit present.     Mental Status: She is alert and oriented to person, place, and time.     Deep Tendon Reflexes: Reflexes normal.  Psychiatric:        Mood and Affect: Mood normal.        Behavior: Behavior normal.    ED Results / Procedures / Treatments   Labs (all labs ordered are listed, but only abnormal results are displayed) Labs Reviewed  WET PREP, GENITAL - Abnormal; Notable for the following components:      Result Value   Clue Cells Wet Prep HPF POC PRESENT (*)    WBC, Wet Prep HPF POC MODERATE (*)    All other components within normal limits  URINALYSIS, ROUTINE W REFLEX MICROSCOPIC - Abnormal; Notable for the following components:   APPearance HAZY (*)    Hgb urine dipstick LARGE (*)    All other components within normal limits  URINALYSIS, MICROSCOPIC (REFLEX) - Abnormal; Notable for the following components:   Bacteria, UA RARE (*)    All other components within normal limits  PREGNANCY, URINE  GC/CHLAMYDIA PROBE AMP ([place]) NOT AT [facility]    EKG None  Radiology No results found.  Procedures Procedures   Medications Ordered in ED Medications  cefTRIAXone (ROCEPHIN) injection 1 g (1 g Intramuscular Given 06/04/21 0035)  azithromycin (ZITHROMAX) powder 1 g (1 g Oral Given 06/04/21 0034)  metroNIDAZOLE (FLAGYL) tablet 2,000 mg (2,000 mg Oral Given 06/04/21 0033)    ED Course  I have reviewed the triage vital signs and the nursing notes.  Pertinent labs & imaging results that were available during my care of the patient were reviewed by me and considered in my medical decision making (see chart for details).   Cultures sent.   Treated for STIs in the ED.  No sexual activity until 7 days after you and all partners are treated.    Kayla Jenkins was evaluated in Emergency Department on 06/04/2021 for the symptoms described in the history of present illness. She was evaluated in the context of the global COVID-19 pandemic, which necessitated consideration that the patient  might be at risk for infection with the SARS-CoV-2 virus that causes COVID-19. Institutional protocols and algorithms that pertain to the evaluation of patients at risk for COVID-19 are in a state of rapid change based on information released by regulatory bodies including the CDC and federal and state organizations. These policies and algorithms were followed during the patient's care in the ED.     Final Clinical Impression(s) / ED Diagnoses Final diagnoses:  Concern about STD in female without diagnosis   Return for intractable cough, coughing up blood, fevers > 100.4 unrelieved by medication, shortness of breath, intractable vomiting, chest pain, shortness of breath, weakness, numbness, changes in speech, facial asymmetry, abdominal pain, passing out, Inability to tolerate liquids or food, cough, altered mental status or any concerns. No signs of systemic illness or infection. The patient is nontoxic-appearing on exam and vital signs are within normal limits. I have reviewed the triage vital signs and the nursing notes. Pertinent labs & imaging results that were available during my care of the patient were reviewed by me and considered in my medical decision making (see chart for details). After history, exam, and medical workup I feel the patient has been appropriately medically screened and is safe for discharge home. Pertinent diagnoses were discussed with the patient. Patient was given return precautions.  Rx / DC Orders ED Discharge Orders     None        Mahaley Schwering, MD 06/04/21 3664

## 2021-06-05 LAB — GC/CHLAMYDIA PROBE AMP (~~LOC~~) NOT AT ARMC
Chlamydia: NEGATIVE
Comment: NEGATIVE
Comment: NORMAL
Neisseria Gonorrhea: NEGATIVE

## 2021-09-17 ENCOUNTER — Encounter: Payer: Self-pay | Admitting: General Practice

## 2021-10-29 ENCOUNTER — Encounter: Payer: Medicaid Other | Admitting: Obstetrics & Gynecology

## 2021-11-03 ENCOUNTER — Encounter: Payer: Self-pay | Admitting: General Practice

## 2023-01-15 ENCOUNTER — Encounter (HOSPITAL_BASED_OUTPATIENT_CLINIC_OR_DEPARTMENT_OTHER): Payer: Self-pay | Admitting: Emergency Medicine

## 2023-01-15 ENCOUNTER — Emergency Department (HOSPITAL_BASED_OUTPATIENT_CLINIC_OR_DEPARTMENT_OTHER)
Admission: EM | Admit: 2023-01-15 | Discharge: 2023-01-15 | Disposition: A | Discharge status: Home / Self Care | Payer: PRIVATE HEALTH INSURANCE | Attending: Emergency Medicine | Admitting: Emergency Medicine

## 2023-01-15 ENCOUNTER — Other Ambulatory Visit: Payer: Self-pay

## 2023-01-15 DIAGNOSIS — U071 COVID-19: Secondary | ICD-10-CM | POA: Insufficient documentation

## 2023-01-15 DIAGNOSIS — R059 Cough, unspecified: Secondary | ICD-10-CM | POA: Diagnosis present

## 2023-01-15 DIAGNOSIS — D649 Anemia, unspecified: Secondary | ICD-10-CM

## 2023-01-15 DIAGNOSIS — R112 Nausea with vomiting, unspecified: Secondary | ICD-10-CM

## 2023-01-15 LAB — BASIC METABOLIC PANEL
Anion gap: 5 (ref 5–15)
BUN: 6 mg/dL (ref 6–20)
CO2: 21 mmol/L — ABNORMAL LOW (ref 22–32)
Calcium: 8.4 mg/dL — ABNORMAL LOW (ref 8.9–10.3)
Chloride: 106 mmol/L (ref 98–111)
Creatinine, Ser: 0.65 mg/dL (ref 0.44–1.00)
GFR, Estimated: >60 mL/min (ref >60)
Glucose, Bld: 98 mg/dL (ref 70–99)
Potassium: 3.7 mmol/L (ref 3.5–5.1)
Sodium: 132 mmol/L — ABNORMAL LOW (ref 135–145)

## 2023-01-15 LAB — CBC WITH DIFFERENTIAL/PLATELET
Abs Immature Granulocytes: 0.04 10*3/uL (ref 0.00–0.07)
Basophils Absolute: 0 10*3/uL (ref 0.0–0.1)
Basophils Relative: 0 %
Eosinophils Absolute: 0 10*3/uL (ref 0.0–0.5)
Eosinophils Relative: 0 %
HCT: 31.3 % — ABNORMAL LOW (ref 36.0–46.0)
Hemoglobin: 9.7 g/dL — ABNORMAL LOW (ref 12.0–15.0)
Immature Granulocytes: 0 %
Lymphocytes Relative: 16 %
Lymphs Abs: 1.5 10*3/uL (ref 0.7–4.0)
MCH: 25.9 pg — ABNORMAL LOW (ref 26.0–34.0)
MCHC: 31 g/dL (ref 30.0–36.0)
MCV: 83.5 fL (ref 80.0–100.0)
Monocytes Absolute: 1.5 10*3/uL — ABNORMAL HIGH (ref 0.1–1.0)
Monocytes Relative: 17 %
Neutro Abs: 5.9 10*3/uL (ref 1.7–7.7)
Neutrophils Relative %: 67 %
Platelets: 232 10*3/uL (ref 150–400)
RBC: 3.75 MIL/uL — ABNORMAL LOW (ref 3.87–5.11)
RDW: 16.1 % — ABNORMAL HIGH (ref 11.5–15.5)
WBC: 9 10*3/uL (ref 4.0–10.5)
nRBC: 0 % (ref 0.0–0.2)

## 2023-01-15 LAB — RESP PANEL BY RT-PCR (RSV, FLU A&B, COVID)  RVPGX2
Influenza A by PCR: NEGATIVE
Influenza B by PCR: NEGATIVE
Resp Syncytial Virus by PCR: NEGATIVE
SARS Coronavirus 2 by RT PCR: POSITIVE — AB

## 2023-01-15 LAB — GROUP A STREP BY PCR: Group A Strep by PCR: NOT DETECTED

## 2023-01-15 MED ORDER — SODIUM CHLORIDE 0.9 % IV BOLUS
1000.0000 mL | Freq: Once | INTRAVENOUS | Status: AC
  Administered 2023-01-15: 1000 mL via INTRAVENOUS

## 2023-01-15 MED ORDER — ALBUTEROL SULFATE HFA 108 (90 BASE) MCG/ACT IN AERS: 1.0000 | INHALATION_SPRAY | RESPIRATORY_TRACT | Status: DC | PRN

## 2023-01-15 MED ORDER — PAXLOVID (300/100) 20 X 150 MG & 10 X 100MG PO TBPK: 3.0000 | ORAL_TABLET | Freq: Two times a day (BID) | ORAL | 0 refills | Status: AC

## 2023-01-15 MED ORDER — METOCLOPRAMIDE HCL 5 MG/ML IJ SOLN
10.0000 mg | Freq: Once | INTRAMUSCULAR | Status: AC
  Administered 2023-01-15: 10 mg via INTRAVENOUS
  Filled 2023-01-15: qty 2

## 2023-01-15 MED ORDER — DEXAMETHASONE SODIUM PHOSPHATE 10 MG/ML IJ SOLN
10.0000 mg | Freq: Once | INTRAMUSCULAR | Status: AC
  Administered 2023-01-15: 10 mg via INTRAVENOUS
  Filled 2023-01-15: qty 1

## 2023-01-15 MED ORDER — ONDANSETRON HCL 4 MG PO TABS: 4.0000 mg | ORAL_TABLET | Freq: Four times a day (QID) | ORAL | 0 refills | Status: AC | PRN

## 2023-01-15 MED ORDER — ONDANSETRON 4 MG PO TBDP
4.0000 mg | ORAL_TABLET | Freq: Once | ORAL | Status: AC
  Administered 2023-01-15: 4 mg via ORAL
  Filled 2023-01-15: qty 1

## 2023-01-15 MED ORDER — KETOROLAC TROMETHAMINE 15 MG/ML IJ SOLN
15.0000 mg | Freq: Once | INTRAMUSCULAR | Status: AC
  Administered 2023-01-15: 15 mg via INTRAVENOUS
  Filled 2023-01-15: qty 1

## 2023-01-15 NOTE — Discharge Instructions (Addendum)
Note the workup today was overall consistent with COVID infection.  Will treat this with antiviral for Paxlovid twice daily for the next 5 days.  Recommend using albuterol inhaler as needed for wheeze.  Recommend symptomatic therapy at home with daily allergy medicine in the form of Zyrtec/Claritin/Allegra, nasal steroid for spray in the form of Nasacort/Flonase, Tylenol/Motrin as needed for fever/body aches.  Will also send in antinausea/vomiting medicine in the form of Zofran to take as needed.  Please do not hesitate to return to emergency department for worrisome signs and symptoms we discussed become apparent.
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
## 2023-01-15 NOTE — ED Provider Notes (Signed)
[facility] [place] Provider Note   CSN: [id number] Arrival date & time: 01/15/23  1209     History  Chief Complaint  Patient presents with   Generalized Body Aches    Kayla Jenkins is a 42 y.o. female.  HPI   42 year old female presents emergency department with complaints of bodyaches, cough, vomiting, headache.  Patient states it is a present for the past 2 days.  Patient states that she works at [employer] with multiple known sick exposures.  States she is concerned for possible COVID infection.  Has been taking over-the-counter medicines with minimal to no relief.  Denies chest pain, shortness of breath, visual disturbance, gait abnormalities, weakness/sensory deficits in upper extremities, slurred speech, facial droop, abdominal pain, hematemesis, urinary/vaginal symptoms. Last menstrual period 12/24/2022 and patient not sexually active since last menstrual period so low suspicion for pregnancy.  Past medical history significant for allergic rhinitis, migraine,  Home Medications Prior to Admission medications   Medication Sig Start Date End Date Taking? Authorizing Provider  nirmatrelvir & ritonavir (PAXLOVID, 300/100,) 20 x 150 MG & 10 x 100MG TBPK Take 3 tablets by mouth 2 (two) times daily for 5 days. 01/15/23 01/20/23 Yes Dion Saucier A, PA  ondansetron (ZOFRAN) 4 MG tablet Take 1 tablet (4 mg total) by mouth every 6 (six) hours as needed for nausea or vomiting. 01/15/23  Yes Dion Saucier A, PA  pregabalin (LYRICA) 100 MG capsule Take 100 mg by mouth 2 (two) times daily.  10/12/20  [provider]      Allergies    Hydrocodone and Tramadol    Review of Systems   Review of Systems  All other systems reviewed and are negative.   Physical Exam Updated Vital Signs BP 115/65 (BP Location: Right Arm)   Pulse 86   Temp 98.6 F (37 C) (Oral)   Resp 17   Wt 83.9 kg   LMP 12/30/2022   SpO2 99%   BMI 33.84 kg/m   Physical Exam Vitals and nursing note reviewed.  Constitutional:      General: She is not in acute distress.    Appearance: She is well-developed.  HENT:     Head: Normocephalic and atraumatic.     Right Ear: Tympanic membrane normal.     Left Ear: Tympanic membrane normal.     Nose: Congestion and rhinorrhea present.     Mouth/Throat:     Pharynx: Posterior oropharyngeal erythema present.     Comments: Mild posterior pharyngeal erythema.  Uvula midline right symmetric with phonation.  No sublingual or submandibular swelling appreciated.  Tonsils are 1+ bilaterally with no obvious exudate. Eyes:     Conjunctiva/sclera: Conjunctivae normal.  Cardiovascular:     Rate and Rhythm: Normal rate and regular rhythm.     Heart sounds: No murmur heard. Pulmonary:     Effort: Pulmonary effort is normal. No respiratory distress.     Breath sounds: Wheezing present.     Comments: Mild wheeze auscultated in bilateral upper lung fields. Abdominal:     Palpations: Abdomen is soft.     Tenderness: There is no abdominal tenderness.  Musculoskeletal:        General: No swelling.     Cervical back: Neck supple. No rigidity or tenderness.     Right lower leg: No edema.     Left lower leg: No edema.  Skin:    General: Skin is warm and dry.     Capillary  Refill: Capillary refill takes less than 2 seconds.  Neurological:     Mental Status: She is alert.     Comments: Alert and oriented to self, place, time and event.   Speech is fluent, clear without dysarthria or dysphasia.   Strength 5/5 in upper/lower extremities   Sensation intact in upper/lower extremities   Normal gait.  Negative Romberg. No pronator drift.  Normal finger-to-nose and feet tapping.  CN I not tested  CN II not tested CN III, IV, VI PERRLA and EOMs intact bilaterally  CN V Intact sensation to sharp and light touch to the face  CN VII facial movements symmetric  CN VIII not tested  CN IX, X no uvula deviation,  symmetric rise of soft palate  CN XI 5/5 SCM and trapezius strength bilaterally  CN XII Midline tongue protrusion, symmetric L/R movements     Psychiatric:        Mood and Affect: Mood normal.     ED Results / Procedures / Treatments   Labs (all labs ordered are listed, but only abnormal results are displayed) Labs Reviewed  RESP PANEL BY RT-PCR (RSV, FLU A&B, COVID)  RVPGX2 - Abnormal; Notable for the following components:      Result Value   SARS Coronavirus 2 by RT PCR POSITIVE (*)    All other components within normal limits  BASIC METABOLIC PANEL - Abnormal; Notable for the following components:   Sodium 132 (*)    CO2 21 (*)    Calcium 8.4 (*)    All other components within normal limits  CBC WITH DIFFERENTIAL/PLATELET - Abnormal; Notable for the following components:   RBC 3.75 (*)    Hemoglobin 9.7 (*)    HCT 31.3 (*)    MCH 25.9 (*)    RDW 16.1 (*)    Monocytes Absolute 1.5 (*)    All other components within normal limits  GROUP A STREP BY PCR    EKG None  Radiology No results found.  Procedures Procedures    Medications Ordered in ED Medications  albuterol (VENTOLIN HFA) 108 (90 Base) MCG/ACT inhaler 1-2 puff (has no administration in time range)  ondansetron (ZOFRAN-ODT) disintegrating tablet 4 mg (4 mg Oral Given 01/15/23 1241)  sodium chloride 0.9 % bolus 1,000 mL ( Intravenous Stopped 01/15/23 1434)  ketorolac (TORADOL) 15 MG/ML injection 15 mg (15 mg Intravenous Given 01/15/23 1330)  metoCLOPramide (REGLAN) injection 10 mg (10 mg Intravenous Given 01/15/23 1333)  dexamethasone (DECADRON) injection 10 mg (10 mg Intravenous Given 01/15/23 1331)    ED Course/ Medical Decision Making/ A&P                             Medical Decision Making Amount and/or Complexity of Data Reviewed Labs: ordered.  Risk Prescription drug management.   This patient presents to the ED for concern of influenza-like illness, this involves an extensive number of  treatment options, and is a complaint that carries with it a high risk of complications and morbidity.  The differential diagnosis includes influenza, COVID, RSV, pneumonia, sepsis, meningitis   Co morbidities that complicate the patient evaluation  See HPI   Additional history obtained:  Additional history obtained from EMR External records from outside source obtained and reviewed including hospital records   Lab Tests:  I Ordered, and personally interpreted labs.  The pertinent results include: Respiratory viral panel positive for COVID.  Group A strep negative.  No leukocytosis.  Evidence of anemia with a hemoglobin of 9.70 which is decreased from prior studies 5+ years ago.  Platelets within normal range.  Mild hyponatremia and decreased bicarb of 130.  21 respectively of which were replenished via IV fluids.  No renal dysfunction.   Imaging Studies ordered:  N/a   Cardiac Monitoring: / EKG:  The patient was maintained on a cardiac monitor.  I personally viewed and interpreted the cardiac monitored which showed an underlying rhythm of: Sinus   Consultations Obtained:  N/a   Problem List / ED Course / Critical interventions / Medication management  COVID I ordered medication including 1 L normal saline, Reglan, Decadron, Toradol for migraine cocktail.  Zofran for antiemetic   Reevaluation of the patient after these medicines showed that the patient improved I have reviewed the patients home medicines and have made adjustments as needed   Social Determinants of Health:  Chronic cigarette use.  Denies illicit drug use.   Test / Admission - Considered:  Covid Vitals signs within normal range and stable throughout visit. Laboratory studies significant for: See above Patient symptoms most likely secondary to current COVID infection.  Patient responded well with resolution of headache with administration of migraine cocktail.  Discussed with patient regarding  antiviral of Paxlovid for current COVID infection of which she was interested in.  Will send home with antiemetic.  Patient tolerating p.o. while emergency department.  Patient recommended symptomatic therapy as discussed in discharge papers.  Patient recommended follow-up primary care for reassessment within 5 to 7 days.  Treatment plan discussed with patient and she acknowledged understanding was agreeable to said plan.  Patient afebrile, no acute respiratory distress and overall well-appearing.  Patient deemed safe for discharge. Worrisome signs and symptoms were discussed with the patient, and the patient acknowledged understanding to return to the ED if noticed. Patient was stable upon discharge.          Final Clinical Impression(s) / ED Diagnoses Final diagnoses:  COVID  Nausea vomiting and diarrhea  Anemia, unspecified type    Rx / DC Orders ED Discharge Orders          Ordered    ondansetron (ZOFRAN) 4 MG tablet  Every 6 hours PRN        01/15/23 1358    nirmatrelvir & ritonavir (PAXLOVID, 300/100,) 20 x 150 MG & 10 x 100MG TBPK  2 times daily        01/15/23 1358              Wilnette Kales, [place] 01/15/23 1502    Elgie Congo, MD 01/15/23 1506

## 2023-01-15 NOTE — ED Triage Notes (Signed)
Headache , body aches , cough , chest discomfort , emesis. Chills fever x 2 days .  Sore throat as well .

## 2023-01-15 NOTE — ED Notes (Signed)
Meds given, popsicle and juice given for po challenge.

## 2023-11-11 ENCOUNTER — Emergency Department (HOSPITAL_BASED_OUTPATIENT_CLINIC_OR_DEPARTMENT_OTHER): Payer: PRIVATE HEALTH INSURANCE

## 2023-11-11 ENCOUNTER — Other Ambulatory Visit: Payer: Self-pay

## 2023-11-11 ENCOUNTER — Emergency Department (HOSPITAL_BASED_OUTPATIENT_CLINIC_OR_DEPARTMENT_OTHER)
Admission: EM | Admit: 2023-11-11 | Discharge: 2023-11-11 | Disposition: A | Discharge status: Home / Self Care | Payer: PRIVATE HEALTH INSURANCE | Attending: Emergency Medicine | Admitting: Emergency Medicine

## 2023-11-11 ENCOUNTER — Encounter (HOSPITAL_BASED_OUTPATIENT_CLINIC_OR_DEPARTMENT_OTHER): Payer: Self-pay | Admitting: Urology

## 2023-11-11 DIAGNOSIS — Z720 Tobacco use: Secondary | ICD-10-CM | POA: Diagnosis not present

## 2023-11-11 DIAGNOSIS — M79674 Pain in right toe(s): Secondary | ICD-10-CM | POA: Insufficient documentation

## 2023-11-11 DIAGNOSIS — M25571 Pain in right ankle and joints of right foot: Secondary | ICD-10-CM | POA: Insufficient documentation
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
## 2023-11-11 NOTE — ED Triage Notes (Signed)
Pt states stubbed pinky toe on right foot on Wednesday and also right ankle pain as well  Ambulatory to triage but pain with weight bearing    H/o sx to right ankle with hardware

## 2023-11-11 NOTE — Discharge Instructions (Signed)
As discussed, x-ray is negative for any fracture or dislocation.  Will send you home with crutches to use as needed to help offload foot/toes.  Recommend continued use of compression, ice, anti-inflammatories for pain.  Recommend follow-up with orthopedics for reevaluation of your symptoms.  Please do not hesitate to return if the worrisome signs and symptoms we discussed become apparent.

## 2023-11-11 NOTE — ED Notes (Signed)
Patient transported to X-ray 

## 2023-11-11 NOTE — ED Notes (Signed)

## 2023-12-07 ENCOUNTER — Encounter (HOSPITAL_BASED_OUTPATIENT_CLINIC_OR_DEPARTMENT_OTHER): Payer: Self-pay | Admitting: Urology

## 2023-12-07 ENCOUNTER — Emergency Department (HOSPITAL_BASED_OUTPATIENT_CLINIC_OR_DEPARTMENT_OTHER)
Admission: EM | Admit: 2023-12-07 | Discharge: 2023-12-07 | Disposition: A | Discharge status: Home / Self Care | Payer: PRIVATE HEALTH INSURANCE | Attending: Emergency Medicine | Admitting: Emergency Medicine

## 2023-12-07 ENCOUNTER — Other Ambulatory Visit: Payer: Self-pay

## 2023-12-07 DIAGNOSIS — Z20822 Contact with and (suspected) exposure to covid-19: Secondary | ICD-10-CM | POA: Insufficient documentation

## 2023-12-07 DIAGNOSIS — R112 Nausea with vomiting, unspecified: Secondary | ICD-10-CM | POA: Diagnosis present

## 2023-12-07 DIAGNOSIS — B349 Viral infection, unspecified: Secondary | ICD-10-CM | POA: Insufficient documentation

## 2023-12-07 LAB — RESP PANEL BY RT-PCR (RSV, FLU A&B, COVID)  RVPGX2
Influenza A by PCR: NEGATIVE
Influenza B by PCR: NEGATIVE
Resp Syncytial Virus by PCR: NEGATIVE
SARS Coronavirus 2 by RT PCR: NEGATIVE

## 2023-12-07 MED ORDER — ONDANSETRON 4 MG PO TBDP
4.0000 mg | ORAL_TABLET | Freq: Once | ORAL | Status: AC
  Administered 2023-12-07: 4 mg via ORAL
  Filled 2023-12-07: qty 1

## 2023-12-07 MED ORDER — LOPERAMIDE HCL 2 MG PO CAPS: 2.0000 mg | ORAL_CAPSULE | Freq: Four times a day (QID) | ORAL | 0 refills | Status: AC | PRN

## 2023-12-07 MED ORDER — GUAIFENESIN 100 MG/5ML PO LIQD
5.0000 mL | Freq: Once | ORAL | Status: AC
  Administered 2023-12-07: 5 mL via ORAL
  Filled 2023-12-07: qty 10

## 2023-12-07 MED ORDER — GUAIFENESIN 100 MG/5ML PO LIQD: 5.0000 mL | ORAL | 0 refills | Status: AC | PRN

## 2023-12-07 MED ORDER — OXYMETAZOLINE HCL 0.05 % NA SOLN
1.0000 | Freq: Once | NASAL | Status: AC
  Administered 2023-12-07: 1 via NASAL
  Filled 2023-12-07: qty 30

## 2023-12-07 MED ORDER — OXYMETAZOLINE HCL 0.05 % NA SOLN: 1.0000 | Freq: Two times a day (BID) | NASAL | 0 refills | Status: AC

## 2023-12-07 MED ORDER — ONDANSETRON 4 MG PO TBDP: 4.0000 mg | ORAL_TABLET | Freq: Three times a day (TID) | ORAL | 0 refills | Status: AC | PRN

## 2023-12-07 NOTE — ED Triage Notes (Signed)
 Pt states congestion, headache, ear and neck pain , body aches, cough that started on Sunday  Denies fever

## 2023-12-07 NOTE — ED Notes (Signed)

## 2023-12-07 NOTE — ED Provider Notes (Signed)
 [facility] Provider Note   CSN: [id number] Arrival date & time: 12/07/23  1819     History  Chief Complaint  Patient presents with   Covid Symptoms     Kayla Jenkins is a 43 y.o. female no significant past medical history presents the ED today for vomiting.  Patient reports nausea, vomiting, diarrhea, body aches, headaches, and cough for the past 3 days.  Denies associated fever or abdominal pain.  No blood in the vomit.  Patient reports taking Alka-Seltzer without any relief of symptoms.  She has been able to tolerate fluids today.  Endorses additional sick contact at work.  No additional complaints or concerns at this time.   Home Medications Prior to Admission medications   Medication Sig Start Date End Date Taking? Authorizing Provider  guaiFENesin  (ROBITUSSIN) 100 MG/5ML liquid Take 5 mLs by mouth every 4 (four) hours as needed for up to 10 days for cough or to loosen phlegm. 12/07/23 12/17/23 Yes Waddell Sluder, PA-C  loperamide  (IMODIUM ) 2 MG capsule Take 1 capsule (2 mg total) by mouth 4 (four) times daily as needed for up to 10 days for diarrhea or loose stools. 12/07/23 12/17/23 Yes Waddell Sluder, PA-C  ondansetron  (ZOFRAN -ODT) 4 MG disintegrating tablet Take 1 tablet (4 mg total) by mouth every 8 (eight) hours as needed for up to 10 days for nausea or vomiting. 12/07/23 12/17/23 Yes Waddell Sluder, PA-C  oxymetazoline  (AFRIN) 0.05 % nasal spray Place 1 spray into both nostrils 2 (two) times daily for 3 days. 12/07/23 12/10/23 Yes Waddell Sluder, PA-C  ondansetron  (ZOFRAN ) 4 MG tablet Take 1 tablet (4 mg total) by mouth every 6 (six) hours as needed for nausea or vomiting. 01/15/23   Silver Wonda LABOR, PA  pregabalin (LYRICA) 100 MG capsule Take 100 mg by mouth 2 (two) times daily.  10/12/20  [provider]      Allergies    Hydrocodone  and Tramadol    Review of Systems   Review of Systems  HENT:  Positive for congestion.   All  other systems reviewed and are negative.   Physical Exam Updated Vital Signs BP 114/74 (BP Location: Left Arm)   Pulse (!) 101   Temp 98.8 F (37.1 C)   Resp 18   Ht 5' 2 (1.575 m)   Wt 83.9 kg   SpO2 100%   BMI 33.83 kg/m  Physical Exam Vitals and nursing note reviewed.  Constitutional:      Appearance: Normal appearance. She is ill-appearing.  HENT:     Head: Normocephalic and atraumatic.     Nose: Congestion present.     Mouth/Throat:     Mouth: Mucous membranes are moist.     Pharynx: No oropharyngeal exudate.  Eyes:     Conjunctiva/sclera: Conjunctivae normal.     Pupils: Pupils are equal, round, and reactive to light.  Cardiovascular:     Rate and Rhythm: Normal rate and regular rhythm.     Pulses: Normal pulses.     Heart sounds: Normal heart sounds.  Pulmonary:     Effort: Pulmonary effort is normal.     Breath sounds: Normal breath sounds.  Abdominal:     Palpations: Abdomen is soft.     Tenderness: There is no abdominal tenderness.  Musculoskeletal:     Cervical back: Normal range of motion. No rigidity.  Skin:    General: Skin is warm and dry.     Findings: No rash.  Neurological:     General: No focal deficit present.     Mental Status: She is alert.  Psychiatric:        Mood and Affect: Mood normal.        Behavior: Behavior normal.    ED Results / Procedures / Treatments   Labs (all labs ordered are listed, but only abnormal results are displayed) Labs Reviewed  RESP PANEL BY RT-PCR (RSV, FLU A&B, COVID)  RVPGX2    EKG None  Radiology No results found.  Procedures Procedures: not indicated.   Medications Ordered in ED Medications  guaiFENesin  (ROBITUSSIN) 100 MG/5ML liquid 5 mL (has no administration in time range)  ondansetron  (ZOFRAN -ODT) disintegrating tablet 4 mg (has no administration in time range)  oxymetazoline  (AFRIN) 0.05 % nasal spray 1 spray (has no administration in time range)    ED Course/ Medical Decision  Making/ A&P                                 Medical Decision Making Risk OTC drugs. Prescription drug management.   This patient presents to the ED for concern of bodyaches and congestion, this involves an extensive number of treatment options, and is a complaint that carries with it a high risk of complications and morbidity.   Differential diagnosis includes: Flu, COVID, RSV, gastroenteritis, other viral illness, etc.   Comorbidities  No significant past medical history   Additional History  Additional history obtained from prior records.   Lab Tests  I ordered and personally interpreted labs.  The pertinent results include:   Negative respiratory panel.   Problem List / ED Course / Critical Interventions / Medication Management  Congestion, nausea, vomiting, diarrhea for the past 3 days without associated shortness of breath or chest pain. I ordered medications including: Robitussin for cough Zofran  for nausea Afrin for congestion Medications were given prior to discharge.  Additional prescriptions were sent to the pharmacy. I have reviewed the patients home medicines and have made adjustments as needed   Social Determinants of Health  Occupation   Test / Admission - Considered  Discussed findings with patient.  All questions were answered. She is hemodynamically stable and safe for discharge home. Return precautions provided.       Final Clinical Impression(s) / ED Diagnoses Final diagnoses:  Viral illness    Rx / DC Orders ED Discharge Orders          Ordered    guaiFENesin  (ROBITUSSIN) 100 MG/5ML liquid  Every 4 hours PRN        12/07/23 1953    ondansetron  (ZOFRAN -ODT) 4 MG disintegrating tablet  Every 8 hours PRN        12/07/23 1953    oxymetazoline  (AFRIN) 0.05 % nasal spray  2 times daily        12/07/23 1953    loperamide  (IMODIUM ) 2 MG capsule  4 times daily PRN        12/07/23 1954              Waddell Sluder,  PA-C 12/07/23 2156    Ruthe Cornet, DO 12/07/23 2335

## 2023-12-07 NOTE — Discharge Instructions (Addendum)
 As discussed, you are negative for flu, COVID, or RSV.  Use Afrin for the next 3 days but then stopped as it can cause rebound symptoms.  Take Zofran  every 8 hours as needed for nausea and vomiting, imodium  4x a day as needed for diarrhea, and robitussin for cough. You can take Mucinex  or Sudafed OTC for congestion.   Make sure you're drinking plenty of fluids.  Follow-up with your primary care provider in 1 week for reevaluation.  Get help right away if: You have trouble breathing. You have a severe headache or a stiff neck. You have severe vomiting or pain in your abdomen.

## 2024-05-27 ENCOUNTER — Encounter: Payer: Self-pay | Admitting: *Deleted

## 2024-05-27 ENCOUNTER — Ambulatory Visit
Admission: EM | Admit: 2024-05-27 | Discharge: 2024-05-27 | Disposition: A | Discharge status: Home / Self Care | Payer: PRIVATE HEALTH INSURANCE | Attending: Physician Assistant | Admitting: Physician Assistant

## 2024-05-27 ENCOUNTER — Other Ambulatory Visit: Payer: Self-pay

## 2024-05-27 DIAGNOSIS — Z113 Encounter for screening for infections with a predominantly sexual mode of transmission: Secondary | ICD-10-CM | POA: Diagnosis present

## 2024-05-27 LAB — POCT URINALYSIS DIP (MANUAL ENTRY)
Bilirubin, UA: NEGATIVE
Glucose, UA: NEGATIVE mg/dL
Ketones, POC UA: NEGATIVE mg/dL
Leukocytes, UA: NEGATIVE
Nitrite, UA: NEGATIVE
Protein Ur, POC: NEGATIVE mg/dL
Spec Grav, UA: 1.02 (ref 1.010–1.025)
Urobilinogen, UA: 0.2 U/dL
pH, UA: 6 (ref 5.0–8.0)
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
## 2024-05-27 NOTE — ED Provider Notes (Signed)
 [facility]    CSN: [id number] Arrival date & time: 05/27/24  1035      History   Chief Complaint Chief Complaint  Patient presents with   Vaginal Discharge    HPI Kayla Jenkins is a 43 y.o. female.   Patient here today for evaluation of vaginal irritation.  She reports she is also had some urinary frequency.  Symptoms started few days ago after condom broke during sex.  She reports she has anxiety and would like screening for STDs.  She denies any genital rash or lesions.   Vaginal Discharge Associated symptoms: abdominal pain   Associated symptoms: no fever, no nausea and no vomiting     Past Medical History:  Diagnosis Date   Back pain    Migraine    Multiple thyroid  nodules 2009   PTSD (post-traumatic stress disorder)    Sciatica     Patient Active Problem List   Diagnosis Date Noted   UTI symptoms 02/14/2014   Vaginitis and vulvovaginitis 12/11/2013   Infection of urinary tract 12/11/2013   Right leg pain 07/24/2013   Neck pain 06/26/2013   Muscle strain, shoulder region 06/20/2013   H/O thyroid  nodule 03/26/2013   Irregular menses 03/24/2013   Allergic rhinitis 02/14/2013   Depression with anxiety 02/14/2013   Tobacco abuse 02/14/2013   History of thyroid  nodule 02/14/2013   Migraines 02/14/2013   Arthralgia 02/14/2013    Past Surgical History:  Procedure Laterality Date   ANKLE SURGERY     CESAREAN SECTION     2001 and 2006   TUBAL LIGATION      OB History   No obstetric history on file.      Home Medications    Prior to Admission medications   Medication Sig Start Date End Date Taking? Authorizing Provider  ondansetron  (ZOFRAN ) 4 MG tablet Take 1 tablet (4 mg total) by mouth every 6 (six) hours as needed for nausea or vomiting. 01/15/23   Silver Wonda LABOR, PA  pregabalin (LYRICA) 100 MG capsule Take 100 mg by mouth 2 (two) times daily.  10/12/20  [provider]    Family History Family History  Problem  Relation Age of Onset   Hypertension Mother    Heart disease Mother        CHF   Hyperlipidemia Mother    Seizures Father    Hyperlipidemia Father    Cancer Neg Hx    Kidney disease Neg Hx    Diabetes Neg Hx     Social History Social History   Tobacco Use   Smoking status: Every Day    Types: Cigarettes   Smokeless tobacco: Never  Vaping Use   Vaping status: Never Used  Substance Use Topics   Alcohol use: No   Drug use: No     Allergies   Hydrocodone  and Tramadol   Review of Systems Review of Systems  Constitutional:  Negative for chills and fever.  Eyes:  Negative for discharge and redness.  Respiratory:  Negative for shortness of breath.   Gastrointestinal:  Positive for abdominal pain. Negative for nausea and vomiting.  Genitourinary:  Positive for frequency and vaginal discharge. Negative for genital sores.     Physical Exam Triage Vital Signs ED Triage Vitals  Encounter Vitals Group     BP 05/27/24 1053 115/80     Girls Systolic BP Percentile --      Girls Diastolic BP Percentile --      Boys Systolic BP  Percentile --      Boys Diastolic BP Percentile --      Pulse Rate 05/27/24 1053 97     Resp 05/27/24 1053 16     Temp 05/27/24 1053 98 F (36.7 C)     Temp Source 05/27/24 1053 Oral     SpO2 05/27/24 1053 97 %     Weight --      Height --      Head Circumference --      Peak Flow --      Pain Score 05/27/24 1048 4     Pain Loc --      Pain Education --      Exclude from Growth Chart --    No data found.  Updated Vital Signs BP 115/80 (BP Location: Left Arm)   Pulse 97   Temp 98 F (36.7 C) (Oral)   Resp 16   LMP 05/09/2024   SpO2 97%   Visual Acuity Right Eye Distance:   Left Eye Distance:   Bilateral Distance:    Right Eye Near:   Left Eye Near:    Bilateral Near:     Physical Exam Vitals and nursing note reviewed.  Constitutional:      General: She is not in acute distress.    Appearance: Normal appearance. She is not  ill-appearing.  HENT:     Head: Normocephalic and atraumatic.   Eyes:     Conjunctiva/sclera: Conjunctivae normal.    Cardiovascular:     Rate and Rhythm: Normal rate.  Pulmonary:     Effort: Pulmonary effort is normal. No respiratory distress.   Neurological:     Mental Status: She is alert.   Psychiatric:        Mood and Affect: Mood normal.        Behavior: Behavior normal.        Thought Content: Thought content normal.      UC Treatments / Results  Labs (all labs ordered are listed, but only abnormal results are displayed) Labs Reviewed  POCT URINALYSIS DIP (MANUAL ENTRY) - Abnormal; Notable for the following components:      Result Value   Clarity, UA hazy (*)    Blood, UA small (*)    All other components within normal limits  HIV ANTIBODY (ROUTINE TESTING W REFLEX)  RPR  CERVICOVAGINAL ANCILLARY ONLY    EKG   Radiology No results found.  Procedures Procedures (including critical care time)  Medications Ordered in UC Medications - No data to display  Initial Impression / Assessment and Plan / UC Course  I have reviewed the triage vital signs and the nursing notes.  Pertinent labs & imaging results that were available during my care of the patient were reviewed by me and considered in my medical decision making (see chart for details).    UA without signs of UTI.  Will order screening for STDs as well as BV and yeast.  Will await results for further recommendation.  Final Clinical Impressions(s) / UC Diagnoses   Final diagnoses:  Screening for STD (sexually transmitted disease)   Discharge Instructions   None    ED Prescriptions   None    PDMP not reviewed this encounter.   Billy Asberry FALCON, PA-C 05/27/24 1116

## 2024-05-28 LAB — RPR: RPR Ser Ql: NONREACTIVE

## 2024-05-28 LAB — HIV ANTIBODY (ROUTINE TESTING W REFLEX): HIV Screen 4th Generation wRfx: NONREACTIVE

## 2024-05-30 ENCOUNTER — Ambulatory Visit (HOSPITAL_COMMUNITY): Payer: Self-pay

## 2024-05-30 LAB — CERVICOVAGINAL ANCILLARY ONLY
Bacterial Vaginitis (gardnerella): POSITIVE — AB
Candida Glabrata: NEGATIVE
Candida Vaginitis: NEGATIVE
Chlamydia: NEGATIVE
Comment: NEGATIVE
Comment: NEGATIVE
Comment: NEGATIVE
Comment: NEGATIVE
Comment: NEGATIVE
Comment: NORMAL
Neisseria Gonorrhea: NEGATIVE
Trichomonas: NEGATIVE

## 2024-05-30 MED ORDER — METRONIDAZOLE 500 MG PO TABS: 500.0000 mg | ORAL_TABLET | Freq: Two times a day (BID) | ORAL | 0 refills | Status: AC
